# Patient Record
Sex: Female | Born: 1960 | Race: White | Hispanic: No | Marital: Married | Smoking: Never smoker
Health system: Southern US, Community
[De-identification: ages and names within clinical notes are randomized; demographics above are authoritative.]

## PROBLEM LIST (undated history)

## (undated) DIAGNOSIS — J45909 Unspecified asthma, uncomplicated: Secondary | ICD-10-CM

## (undated) DIAGNOSIS — F329 Major depressive disorder, single episode, unspecified: Secondary | ICD-10-CM

## (undated) DIAGNOSIS — F419 Anxiety disorder, unspecified: Secondary | ICD-10-CM

## (undated) DIAGNOSIS — F32A Depression, unspecified: Secondary | ICD-10-CM

## (undated) HISTORY — PX: ABLATION: SHX5711

## (undated) HISTORY — DX: Anxiety disorder, unspecified: F41.9

## (undated) HISTORY — DX: Depression, unspecified: F32.A

## (undated) HISTORY — DX: Unspecified asthma, uncomplicated: J45.909

## (undated) HISTORY — DX: Major depressive disorder, single episode, unspecified: F32.9

---

## 2017-12-23 ENCOUNTER — Encounter: Payer: Self-pay | Admitting: Physician Assistant

## 2017-12-23 ENCOUNTER — Ambulatory Visit: Payer: BLUE CROSS/BLUE SHIELD | Admitting: Physician Assistant

## 2017-12-23 VITALS — BP 117/68 | HR 80 | Ht 63.0 in | Wt 166.4 lb

## 2017-12-23 DIAGNOSIS — Z1211 Encounter for screening for malignant neoplasm of colon: Secondary | ICD-10-CM

## 2017-12-23 DIAGNOSIS — G5702 Lesion of sciatic nerve, left lower limb: Secondary | ICD-10-CM

## 2017-12-23 DIAGNOSIS — F339 Major depressive disorder, recurrent, unspecified: Secondary | ICD-10-CM | POA: Diagnosis not present

## 2017-12-23 MED ORDER — DULOXETINE HCL 30 MG PO CPEP: 30.0000 mg | ORAL_CAPSULE | Freq: Every day | ORAL | 2 refills | Status: DC

## 2017-12-23 MED ORDER — OMEPRAZOLE 20 MG PO CPDR: 20.0000 mg | DELAYED_RELEASE_CAPSULE | Freq: Every day | ORAL | 3 refills | Status: DC

## 2017-12-23 MED ORDER — FLUOXETINE HCL 20 MG PO TABS
20.0000 mg | ORAL_TABLET | Freq: Every day | ORAL | 3 refills | Status: DC
Start: 2017-12-23 — End: 2017-12-23

## 2017-12-23 MED ORDER — HYDROCODONE-ACETAMINOPHEN 10-325 MG PO TABS: 1.0000 | ORAL_TABLET | ORAL | 0 refills | Status: DC | PRN

## 2017-12-25 DIAGNOSIS — F339 Major depressive disorder, recurrent, unspecified: Secondary | ICD-10-CM | POA: Insufficient documentation

## 2017-12-25 DIAGNOSIS — G5702 Lesion of sciatic nerve, left lower limb: Secondary | ICD-10-CM | POA: Insufficient documentation

## 2017-12-25 NOTE — Progress Notes (Signed)
BP 117/68   Pulse 80   Ht  (1.6 m)   Wt 166 lb 6.4 oz (75.5 kg)   BMI 29.48 kg/m    Subjective:    Patient ID: Victoria Perry, female    DOB: [birth year], 59 y.o.   MRN: [id number]  HPI: Victoria Perry is a 57 y.o. female presenting on 12/23/2017 for New Patient (Initial Visit) and Establish Care  This is a new patient to our office.  She does have  medical history is positive for depression and mood disorder.  She also has degenerative disc disease with some sciatica.  She does have chronic pain in her sciatic nerve.  When she takes an anti-inflammatory it does help and she does not have any chronic GERD problems.  I would like her to stay on this on a more consistent basis we discussed using omeprazole as protection for her stomach.  Many years ago she did have some GERD problems.  She denies anything at this time.  She does still have some anxiety and depression most days.  Past Medical History:  Diagnosis Date  . Anxiety   . Asthma   . Depression    Relevant past medical, surgical, family and social history reviewed and updated as indicated. Interim medical history since our last visit reviewed. Allergies and medications reviewed and updated. DATA REVIEWED: CHART IN EPIC  Family History reviewed for pertinent findings.  Review of Systems  Constitutional: Negative.  Negative for activity change, fatigue and fever.  HENT: Negative.   Eyes: Negative.   Respiratory: Negative.  Negative for cough.   Cardiovascular: Negative.  Negative for chest pain.  Gastrointestinal: Negative.  Negative for abdominal pain.  Endocrine: Negative.   Genitourinary: Negative.  Negative for dysuria.  Musculoskeletal: Positive for arthralgias, back pain, myalgias, neck pain and neck stiffness.  Skin: Negative.   Neurological: Negative.   Psychiatric/Behavioral: Positive for decreased concentration and dysphoric mood. The patient is nervous/anxious.     Allergies as of 12/23/2017   No  Known Allergies     Medication List        Accurate as of 12/23/17 11:59 PM. Always use your most recent med list.          DULoxetine 30 MG capsule Commonly known as:  CYMBALTA Take 1 capsule (30 mg total) by mouth daily. After 1 week take 2 tabs   HYDROcodone-acetaminophen 10-325 MG tablet Commonly known as:  NORCO Take 1 tablet by mouth every 4 (four) hours as needed.   omeprazole 20 MG capsule Commonly known as:  PRILOSEC Take 1 capsule (20 mg total) by mouth daily.          Objective:    BP 117/68   Pulse 80   Ht  (1.6 m)   Wt 166 lb 6.4 oz (75.5 kg)   BMI 29.48 kg/m   No Known Allergies  Wt Readings from Last 3 Encounters:  12/23/17 166 lb 6.4 oz (75.5 kg)    Physical Exam  Constitutional: She is oriented to person, place, and time. She appears well-developed and well-nourished.  HENT:  Head: Normocephalic and atraumatic.  Eyes: Pupils are equal, round, and reactive to light. Conjunctivae and EOM are normal.  Cardiovascular: Normal rate, regular rhythm, normal heart sounds and intact distal pulses.  Pulmonary/Chest: Effort normal and breath sounds normal.  Abdominal: Soft. Bowel sounds are normal.  Neurological: She is alert and oriented to person, place, and time. She has normal reflexes.  Skin:  Skin is warm and dry. No rash noted.  Psychiatric: She has a normal mood and affect. Her behavior is normal. Judgment and thought content normal.    No results found for this or any previous visit.    Assessment & Plan:   1. Encounter for screening colonoscopy - Ambulatory referral to Gastroenterology  2. Depression, recurrent (HCC) - DULoxetine (CYMBALTA) 30 MG capsule; Take 1 capsule (30 mg total) by mouth daily. After 1 week take 2 tabs  Dispense: 60 capsule; Refill: 2  3. Neuropathy of left sciatic nerve - HYDROcodone-acetaminophen (NORCO) 10-325 MG tablet; Take 1 tablet by mouth every 4 (four) hours as needed.  Dispense: 30 tablet; Refill: 0 -  DULoxetine (CYMBALTA) 30 MG capsule; Take 1 capsule (30 mg total) by mouth daily. After 1 week take 2 tabs  Dispense: 60 capsule; Refill: 2    Continue all other maintenance medications as listed above.  Follow up plan: Return in about 1 month (around 01/20/2018) for recheck.  Educational handout given for survey  Remus Loffler PA-C [facility] [address]  [place], [place] [address] [phone]   12/25/2017, 9:40 PM

## 2017-12-27 ENCOUNTER — Encounter: Payer: Self-pay | Admitting: Internal Medicine

## 2018-01-17 ENCOUNTER — Encounter: Payer: Self-pay | Admitting: Physician Assistant

## 2018-01-17 ENCOUNTER — Ambulatory Visit: Payer: BLUE CROSS/BLUE SHIELD | Admitting: Physician Assistant

## 2018-01-17 DIAGNOSIS — F339 Major depressive disorder, recurrent, unspecified: Secondary | ICD-10-CM

## 2018-01-17 DIAGNOSIS — G5702 Lesion of sciatic nerve, left lower limb: Secondary | ICD-10-CM | POA: Diagnosis not present

## 2018-01-17 MED ORDER — OMEPRAZOLE 20 MG PO CPDR: 20.0000 mg | DELAYED_RELEASE_CAPSULE | Freq: Every day | ORAL | 11 refills | Status: DC

## 2018-01-17 MED ORDER — DULOXETINE HCL 60 MG PO CPEP: 60.0000 mg | ORAL_CAPSULE | Freq: Every day | ORAL | 5 refills | Status: DC

## 2018-01-17 MED ORDER — HYDROCODONE-ACETAMINOPHEN 10-325 MG PO TABS: 1.0000 | ORAL_TABLET | ORAL | 0 refills | Status: DC | PRN

## 2018-01-17 NOTE — Patient Instructions (Signed)
In a few days you may receive a survey in the mail or online from [employer] regarding your visit with us today. Please take a moment to fill this out. Your feedback is very important to our whole office. It can help us better understand your needs as well as improve your experience and satisfaction. Thank you for taking your time to complete it. We care about you.  Cypress Hinkson, PA-C  

## 2018-01-18 NOTE — Progress Notes (Signed)
BP 110/62   Pulse 69   Temp 98.2 F (36.8 C) (Oral)   Ht  (1.6 m)   Wt 164 lb (74.4 kg)   BMI 29.05 kg/m    Subjective:    Patient ID: Victoria Perry, female    DOB: December 13, 1960, 57 y.o.   MRN: [id number]  HPI: Victoria Perry is a 57 y.o. female presenting on 01/17/2018 for Depression (1 month follow up ) and Back Pain (medication refill)  This patient comes in for a one-month follow-up on her depression and back pain.  She states that she is tolerating the Cymbalta extremely well.  And she states that she has a great improvement in her emotions and depression.  She is feeling more energetic.  She is able to take 1 or 2 pain pills a day and it can control on the severe amount of pain that she has.  She is tolerating all of her medications very well.  Past Medical History:  Diagnosis Date  . Anxiety   . Asthma   . Depression    Relevant past medical, surgical, family and social history reviewed and updated as indicated. Interim medical history since our last visit reviewed. Allergies and medications reviewed and updated. DATA REVIEWED: CHART IN EPIC  Family History reviewed for pertinent findings.  Review of Systems  Constitutional: Negative.   HENT: Negative.   Eyes: Negative.   Respiratory: Negative.   Gastrointestinal: Negative.   Genitourinary: Negative.     Allergies as of 01/17/2018   No Known Allergies     Medication List        Accurate as of 01/17/18 11:59 PM. Always use your most recent med list.          DULoxetine 60 MG capsule Commonly known as:  CYMBALTA Take 1 capsule (60 mg total) by mouth daily.   HYDROcodone-acetaminophen 10-325 MG tablet Commonly known as:  NORCO Take 1 tablet by mouth every 4 (four) hours as needed.   omeprazole 20 MG capsule Commonly known as:  PRILOSEC Take 1 capsule (20 mg total) by mouth daily.          Objective:    BP 110/62   Pulse 69   Temp 98.2 F (36.8 C) (Oral)   Ht  (1.6 m)    Wt 164 lb (74.4 kg)   BMI 29.05 kg/m   No Known Allergies  Wt Readings from Last 3 Encounters:  01/17/18 164 lb (74.4 kg)  12/23/17 166 lb 6.4 oz (75.5 kg)    Physical Exam  Constitutional: She is oriented to person, place, and time. She appears well-developed and well-nourished.  HENT:  Head: Normocephalic and atraumatic.  Eyes: Pupils are equal, round, and reactive to light. Conjunctivae and EOM are normal.  Cardiovascular: Normal rate, regular rhythm, normal heart sounds and intact distal pulses.  Pulmonary/Chest: Effort normal and breath sounds normal.  Abdominal: Soft. Bowel sounds are normal.  Neurological: She is alert and oriented to person, place, and time. She has normal reflexes.  Skin: Skin is warm and dry. No rash noted.  Psychiatric: She has a normal mood and affect. Her behavior is normal. Judgment and thought content normal.    No results found for this or any previous visit.    Assessment & Plan:   1. Depression, recurrent (HCC) - DULoxetine (CYMBALTA) 60 MG capsule; Take 1 capsule (60 mg total) by mouth daily.  Dispense: 30 capsule; Refill: 5  2. Neuropathy of left  sciatic nerve - DULoxetine (CYMBALTA) 60 MG capsule; Take 1 capsule (60 mg total) by mouth daily.  Dispense: 30 capsule; Refill: 5 - HYDROcodone-acetaminophen (NORCO) 10-325 MG tablet; Take 1 tablet by mouth every 4 (four) hours as needed.  Dispense: 90 tablet; Refill: 0   Continue all other maintenance medications as listed above.  Follow up plan: Return in about 3 months (around 04/19/2018).  Educational handout given for survey  Remus Loffler PA-C [facility] [address]  [place], [place] [address] [phone]   01/18/2018, 10:21 AM

## 2018-02-01 ENCOUNTER — Ambulatory Visit (INDEPENDENT_AMBULATORY_CARE_PROVIDER_SITE_OTHER): Payer: BLUE CROSS/BLUE SHIELD | Admitting: Physician Assistant

## 2018-02-01 ENCOUNTER — Encounter: Payer: Self-pay | Admitting: Physician Assistant

## 2018-02-01 VITALS — BP 123/68 | HR 62 | Temp 97.3°F | Ht 63.0 in | Wt 163.2 lb

## 2018-02-01 DIAGNOSIS — Z01419 Encounter for gynecological examination (general) (routine) without abnormal findings: Secondary | ICD-10-CM

## 2018-02-01 DIAGNOSIS — N952 Postmenopausal atrophic vaginitis: Secondary | ICD-10-CM

## 2018-02-01 MED ORDER — ESTROGENS, CONJUGATED 0.625 MG/GM VA CREA: 1.0000 | TOPICAL_CREAM | Freq: Every day | VAGINAL | 12 refills | Status: DC

## 2018-02-03 ENCOUNTER — Telehealth: Payer: Self-pay | Admitting: Physician Assistant

## 2018-02-03 LAB — PAP IG W/ RFLX HPV ASCU: PAP Smear Comment: 0

## 2018-02-03 NOTE — Telephone Encounter (Signed)
Aware. 

## 2018-02-05 NOTE — Progress Notes (Signed)
BP 123/68   Pulse 62   Temp (!) 97.3 F (36.3 C) (Oral)   Ht _0  (1.6 m)   Wt 163 lb 3.2 oz (74 kg)   BMI 28.91 kg/m    Subjective:    Patient ID: Victoria Perry, female    DOB: Feb 08, 1961, 57 y.o.   MRN: [id number]  HPI: Victoria Perry is a 57 y.o. female presenting on 02/01/2018 for Annual Exam  This patient comes in for annual well physical examination. All medications are reviewed today. There are no reports of any problems with the medications. All of the medical conditions are reviewed and updated.  Lab work is reviewed and will be ordered as medically necessary. There are no new problems reported with today's visit.  Patient reports doing well overall.  Ever since her hysterectomy she has experienced vaginal dryness. Does not want to take oral estrogen.  Past Medical History:  Diagnosis Date  . Anxiety   . Asthma   . Depression    Relevant past medical, surgical, family and social history reviewed and updated as indicated. Interim medical history since our last visit reviewed. Allergies and medications reviewed and updated. DATA REVIEWED: CHART IN EPIC  Family History reviewed for pertinent findings.  Review of Systems  Constitutional: Negative.  Negative for activity change, fatigue and fever.  HENT: Negative.   Eyes: Negative.   Respiratory: Negative.  Negative for cough.   Cardiovascular: Negative.  Negative for chest pain.  Gastrointestinal: Negative.  Negative for abdominal pain.  Endocrine: Negative.   Genitourinary: Negative.  Negative for dysuria.  Musculoskeletal: Negative.   Skin: Negative.   Neurological: Negative.     Allergies as of 02/01/2018   No Known Allergies     Medication List        Accurate as of 02/01/18 11:59 PM. Always use your most recent med list.          conjugated estrogens vaginal cream Commonly known as:  PREMARIN Place 1 Applicatorful vaginally daily.   DULoxetine 60 MG capsule Commonly known as:   CYMBALTA Take 1 capsule (60 mg total) by mouth daily.   HYDROcodone-acetaminophen 10-325 MG tablet Commonly known as:  NORCO Take 1 tablet by mouth every 4 (four) hours as needed.   omeprazole 20 MG capsule Commonly known as:  PRILOSEC Take 1 capsule (20 mg total) by mouth daily.          Objective:    BP 123/68   Pulse 62   Temp (!) 97.3 F (36.3 C) (Oral)   Ht _1  (1.6 m)   Wt 163 lb 3.2 oz (74 kg)   BMI 28.91 kg/m   No Known Allergies  Wt Readings from Last 3 Encounters:  02/01/18 163 lb 3.2 oz (74 kg)  01/17/18 164 lb (74.4 kg)  12/23/17 166 lb 6.4 oz (75.5 kg)    Physical Exam  Constitutional: She is oriented to person, place, and time. She appears well-developed and well-nourished.  HENT:  Head: Normocephalic and atraumatic.  Eyes: Pupils are equal, round, and reactive to light. Conjunctivae and EOM are normal.  Neck: Normal range of motion. Neck supple.  Cardiovascular: Normal rate, regular rhythm, normal heart sounds and intact distal pulses.  Pulmonary/Chest: Effort normal and breath sounds normal. Right breast exhibits no mass, no skin change and no tenderness. Left breast exhibits no mass, no skin change and no tenderness. No breast tenderness, discharge or bleeding. Breasts are symmetrical.  Abdominal: Soft. Bowel sounds are  normal.  Genitourinary: Vagina normal and uterus normal. Rectal exam shows no fissure. No breast tenderness, discharge or bleeding. There is no tenderness or lesion on the right labia. There is no tenderness or lesion on the left labia. Uterus is not deviated, not enlarged and not tender. Cervix exhibits no motion tenderness, no discharge and no friability. Right adnexum displays no mass, no tenderness and no fullness. Left adnexum displays no mass, no tenderness and no fullness. No tenderness or bleeding in the vagina. No vaginal discharge found.  Neurological: She is alert and oriented to person, place, and time. She has normal reflexes.   Skin: Skin is warm and dry. No rash noted.  Psychiatric: She has a normal mood and affect. Her behavior is normal. Judgment and thought content normal.        Assessment & Plan:   1. Well female exam with routine gynecological exam - CBC with Differential/Platelet; Future - CMP14+EGFR; Future - Lipid panel; Future - TSH; Future - Pap IG w/ reflex to HPV when ASC-U  2. Vaginal atrophy - conjugated estrogens (PREMARIN) vaginal cream; Place 1 Applicatorful vaginally daily.  Dispense: 42.5 g; Refill: 12   Continue all other maintenance medications as listed above.  Follow up plan: No follow-ups on file.  Educational handout given for [place] PA-C [place] [address]  [place], [place] [address] [phone]   02/05/2018, 5:00 PM

## 2018-02-09 ENCOUNTER — Other Ambulatory Visit: Payer: Self-pay | Admitting: Physician Assistant

## 2018-02-09 DIAGNOSIS — F339 Major depressive disorder, recurrent, unspecified: Secondary | ICD-10-CM

## 2018-02-09 DIAGNOSIS — G5702 Lesion of sciatic nerve, left lower limb: Secondary | ICD-10-CM

## 2018-02-20 ENCOUNTER — Ambulatory Visit: Payer: Self-pay

## 2018-02-20 ENCOUNTER — Other Ambulatory Visit: Payer: BLUE CROSS/BLUE SHIELD

## 2018-02-20 DIAGNOSIS — Z01419 Encounter for gynecological examination (general) (routine) without abnormal findings: Secondary | ICD-10-CM

## 2018-02-20 LAB — HM MAMMOGRAPHY

## 2018-02-21 LAB — CBC WITH DIFFERENTIAL/PLATELET
BASOS ABS: 0 10*3/uL (ref 0.0–0.2)
Basos: 0 %
EOS (ABSOLUTE): 0.4 10*3/uL (ref 0.0–0.4)
EOS: 6 %
Hematocrit: 38.7 % (ref 34.0–46.6)
Hemoglobin: 12.9 g/dL (ref 11.1–15.9)
IMMATURE GRANULOCYTES: 0 %
Immature Grans (Abs): 0 10*3/uL (ref 0.0–0.1)
Lymphocytes Absolute: 1.8 10*3/uL (ref 0.7–3.1)
Lymphs: 26 %
MCH: 29.5 pg (ref 26.6–33.0)
MCHC: 33.3 g/dL (ref 31.5–35.7)
MCV: 89 fL (ref 79–97)
MONOS ABS: 0.5 10*3/uL (ref 0.1–0.9)
Monocytes: 8 %
NEUTROS PCT: 60 %
Neutrophils Absolute: 4 10*3/uL (ref 1.4–7.0)
PLATELETS: 302 10*3/uL (ref 150–450)
RBC: 4.37 x10E6/uL (ref 3.77–5.28)
RDW: 13.5 % (ref 12.3–15.4)
WBC: 6.8 10*3/uL (ref 3.4–10.8)

## 2018-02-21 LAB — LIPID PANEL
CHOLESTEROL TOTAL: 161 mg/dL (ref 100–199)
Chol/HDL Ratio: 2.2 ratio (ref 0.0–4.4)
HDL: 73 mg/dL (ref >39)
LDL Calculated: 77 mg/dL (ref 0–99)
Triglycerides: 53 mg/dL (ref 0–149)
VLDL Cholesterol Cal: 11 mg/dL (ref 5–40)

## 2018-02-21 LAB — CMP14+EGFR
ALBUMIN: 4.6 g/dL (ref 3.5–5.5)
ALK PHOS: 69 IU/L (ref 39–117)
ALT: 16 IU/L (ref 0–32)
AST: 14 IU/L (ref 0–40)
Albumin/Globulin Ratio: 2.4 — ABNORMAL HIGH (ref 1.2–2.2)
BUN / CREAT RATIO: 21 (ref 9–23)
BUN: 16 mg/dL (ref 6–24)
Bilirubin Total: 0.3 mg/dL (ref 0.0–1.2)
CALCIUM: 9 mg/dL (ref 8.7–10.2)
CO2: 24 mmol/L (ref 20–29)
CREATININE: 0.78 mg/dL (ref 0.57–1.00)
Chloride: 108 mmol/L — ABNORMAL HIGH (ref 96–106)
GFR calc Af Amer: 97 mL/min/{1.73_m2} (ref >59)
GFR, EST NON AFRICAN AMERICAN: 84 mL/min/{1.73_m2} (ref >59)
GLOBULIN, TOTAL: 1.9 g/dL (ref 1.5–4.5)
GLUCOSE: 100 mg/dL — AB (ref 65–99)
POTASSIUM: 4.5 mmol/L (ref 3.5–5.2)
SODIUM: 143 mmol/L (ref 134–144)
Total Protein: 6.5 g/dL (ref 6.0–8.5)

## 2018-02-21 LAB — TSH: TSH: 1.67 u[IU]/mL (ref 0.450–4.500)

## 2018-03-10 ENCOUNTER — Other Ambulatory Visit: Payer: Self-pay | Admitting: Physician Assistant

## 2018-03-10 DIAGNOSIS — G5702 Lesion of sciatic nerve, left lower limb: Secondary | ICD-10-CM

## 2018-03-10 NOTE — Telephone Encounter (Signed)
Refills during appts appt made for 03/13/18

## 2018-03-13 ENCOUNTER — Ambulatory Visit: Payer: Self-pay | Admitting: Physician Assistant

## 2018-03-13 ENCOUNTER — Ambulatory Visit: Payer: BLUE CROSS/BLUE SHIELD | Admitting: Physician Assistant

## 2018-03-13 ENCOUNTER — Encounter: Payer: Self-pay | Admitting: Physician Assistant

## 2018-03-13 VITALS — BP 119/73 | HR 72 | Temp 98.2°F | Ht 63.0 in | Wt 159.2 lb

## 2018-03-13 DIAGNOSIS — R197 Diarrhea, unspecified: Secondary | ICD-10-CM

## 2018-03-13 DIAGNOSIS — G5702 Lesion of sciatic nerve, left lower limb: Secondary | ICD-10-CM | POA: Diagnosis not present

## 2018-03-13 DIAGNOSIS — F339 Major depressive disorder, recurrent, unspecified: Secondary | ICD-10-CM | POA: Diagnosis not present

## 2018-03-13 MED ORDER — HYDROCODONE-ACETAMINOPHEN 10-325 MG PO TABS: 1.0000 | ORAL_TABLET | ORAL | 0 refills | Status: DC

## 2018-03-13 MED ORDER — HYDROCODONE-ACETAMINOPHEN 10-325 MG PO TABS: 1.0000 | ORAL_TABLET | Freq: Three times a day (TID) | ORAL | 0 refills | Status: DC | PRN

## 2018-03-13 MED ORDER — DULOXETINE HCL 60 MG PO CPEP: ORAL_CAPSULE | ORAL | 5 refills | Status: DC

## 2018-03-13 NOTE — Progress Notes (Signed)
BP 119/73   Pulse 72   Temp 98.2 F (36.8 C) (Oral)   Ht _0  (1.6 m)   Wt 159 lb 3.4 oz (72.2 kg)   BMI 28.20 kg/m    Subjective:    Patient ID: Victoria Perry, female    DOB: [birth year], 57 y.o.   MRN: [id number]  HPI: Victoria Perry is a 57 y.o. female presenting on 03/13/2018 for Diarrhea; Dizziness; Chills; and Depression (1 month follow up )   This patient comes in for 1 month recheck on her depression.  She has had a great improvement since starting the Cymbalta 60 mg 1 daily.  She states she thinks she can tell a difference also with her osteoarthritis pain.  She would like to continue the medicine.  She did start with a stomach virus approximately 1 day ago.  She was able to come to her visit today. This patient comes in with 2 day history of nausea and vomiting.  In the beginning nausea and vomiting were the only symptoms and halfway through more diarrhea.  Last time the patient ate was 2 days ago Positive exposure to others with gastroenteritis Denies fever. Denies blood.  Depression screen [facility] 2/9 03/13/2018 02/01/2018 01/17/2018 12/23/2017  Decreased Interest 0 0 0 2  Down, Depressed, Hopeless 0 0 0 3  PHQ - 2 Score 0 0 0 5  Altered sleeping - - - 3  Tired, decreased energy - - - 3  Change in appetite - - - 2  Feeling bad or failure about yourself  - - - 3  Trouble concentrating - - - 3  Moving slowly or fidgety/restless - - - 2  Suicidal thoughts - - - 3  PHQ-9 Score - - - 24     Past Medical History:  Diagnosis Date  . Anxiety   . Asthma   . Depression    Relevant past medical, surgical, family and social history reviewed and updated as indicated. Interim medical history since our last visit reviewed. Allergies and medications reviewed and updated. DATA REVIEWED: CHART IN EPIC  Family History reviewed for pertinent findings.  Review of Systems  Constitutional: Negative.   HENT: Negative.   Eyes: Negative.   Respiratory: Negative.     Gastrointestinal: Positive for diarrhea and nausea. Negative for vomiting.  Genitourinary: Negative.   Musculoskeletal: Positive for arthralgias and myalgias.    Allergies as of 03/13/2018   No Known Allergies     Medication List        Accurate as of 03/13/18 11:59 PM. Always use your most recent med list.          conjugated estrogens vaginal cream Commonly known as:  PREMARIN Place 1 Applicatorful vaginally daily.   DULoxetine 60 MG capsule Commonly known as:  CYMBALTA TAKE 1 CAPSULE BY MOUTH EVERY DAY   HYDROcodone-acetaminophen 10-325 MG tablet Commonly known as:  NORCO Take 1 tablet by mouth every 8 (eight) weeks.   HYDROcodone-acetaminophen 10-325 MG tablet Commonly known as:  NORCO Take 1 tablet by mouth every 8 (eight) hours as needed.   HYDROcodone-acetaminophen 10-325 MG tablet Commonly known as:  NORCO Take 1 tablet by mouth every 8 (eight) hours as needed.   omeprazole 20 MG capsule Commonly known as:  PRILOSEC Take 1 capsule (20 mg total) by mouth daily.          Objective:    BP 119/73   Pulse 72   Temp 98.2 F (36.8 C) (  Oral)   Ht _0  (1.6 m)   Wt 159 lb 3.4 oz (72.2 kg)   BMI 28.20 kg/m   No Known Allergies  Wt Readings from Last 3 Encounters:  03/13/18 159 lb 3.4 oz (72.2 kg)  02/01/18 163 lb 3.2 oz (74 kg)  01/17/18 164 lb (74.4 kg)    Physical Exam  Constitutional: She is oriented to person, place, and time. She appears well-developed and well-nourished.  HENT:  Head: Normocephalic and atraumatic.  Eyes: Pupils are equal, round, and reactive to light. Conjunctivae and EOM are normal.  Cardiovascular: Normal rate, regular rhythm, normal heart sounds and intact distal pulses.  Pulmonary/Chest: Effort normal and breath sounds normal.  Abdominal: Soft. She exhibits no distension and no mass. Bowel sounds are increased. There is tenderness in the right upper quadrant, epigastric area and left upper quadrant. There is no rebound and  no guarding.  Neurological: She is alert and oriented to person, place, and time. She has normal reflexes.  Skin: Skin is warm and dry. No rash noted.  Psychiatric: She has a normal mood and affect. Her behavior is normal. Judgment and thought content normal.    Results for orders placed or performed in visit on 02/20/18  TSH  Result Value Ref Range   TSH 1.670 0.450 - 4.500 uIU/mL  Lipid panel  Result Value Ref Range   Cholesterol, Total 161 100 - 199 mg/dL   Triglycerides 53 0 - 149 mg/dL   HDL 73 >39 mg/dL   VLDL Cholesterol Cal 11 5 - 40 mg/dL   LDL Calculated 77 0 - 99 mg/dL   Chol/HDL Ratio 2.2 0.0 - 4.4 ratio  CMP14+EGFR  Result Value Ref Range   Glucose 100 (H) 65 - 99 mg/dL   BUN 16 6 - 24 mg/dL   Creatinine, Ser 0.78 0.57 - 1.00 mg/dL   GFR calc non Af Amer 84 >59 mL/min/1.73   GFR calc Af Amer 97 >59 mL/min/1.73   BUN/Creatinine Ratio 21 9 - 23   Sodium 143 134 - 144 mmol/L   Potassium 4.5 3.5 - 5.2 mmol/L   Chloride 108 (H) 96 - 106 mmol/L   CO2 24 20 - 29 mmol/L   Calcium 9.0 8.7 - 10.2 mg/dL   Total Protein 6.5 6.0 - 8.5 g/dL   Albumin 4.6 3.5 - 5.5 g/dL   Globulin, Total 1.9 1.5 - 4.5 g/dL   Albumin/Globulin Ratio 2.4 (H) 1.2 - 2.2   Bilirubin Total 0.3 0.0 - 1.2 mg/dL   Alkaline Phosphatase 69 39 - 117 IU/L   AST 14 0 - 40 IU/L   ALT 16 0 - 32 IU/L  CBC with Differential/Platelet  Result Value Ref Range   WBC 6.8 3.4 - 10.8 x10E3/uL   RBC 4.37 3.77 - 5.28 x10E6/uL   Hemoglobin 12.9 11.1 - 15.9 g/dL   Hematocrit 38.7 34.0 - 46.6 %   MCV 89 79 - 97 fL   MCH 29.5 26.6 - 33.0 pg   MCHC 33.3 31.5 - 35.7 g/dL   RDW 13.5 12.3 - 15.4 %   Platelets 302 150 - 450 x10E3/uL   Neutrophils 60 Not Estab. %   Lymphs 26 Not Estab. %   Monocytes 8 Not Estab. %   Eos 6 Not Estab. %   Basos 0 Not Estab. %   Neutrophils Absolute 4.0 1.4 - 7.0 x10E3/uL   Lymphocytes Absolute 1.8 0.7 - 3.1 x10E3/uL   Monocytes Absolute 0.5 0.1 - 0.9 x10E3/uL  EOS (ABSOLUTE) 0.4 0.0  - 0.4 x10E3/uL   Basophils Absolute 0.0 0.0 - 0.2 x10E3/uL   Immature Granulocytes 0 Not Estab. %   Immature Grans (Abs) 0.0 0.0 - 0.1 x10E3/uL      Assessment & Plan:   1. Depression, recurrent (HCC) - DULoxetine (CYMBALTA) 60 MG capsule; TAKE 1 CAPSULE BY MOUTH EVERY DAY  Dispense: 30 capsule; Refill: 5  2. Neuropathy of left sciatic nerve - DULoxetine (CYMBALTA) 60 MG capsule; TAKE 1 CAPSULE BY MOUTH EVERY DAY  Dispense: 30 capsule; Refill: 5 - HYDROcodone-acetaminophen (NORCO) 10-325 MG tablet; Take 1 tablet by mouth every 8 (eight) weeks.  Dispense: 90 tablet; Refill: 0  3. Diarrhea of presumed infectious origin BRAT diet   Continue all other maintenance medications as listed above.  Follow up plan: Return in about 4 months (around 07/14/2018) for recheck.  Educational handout given for [place] PA-C [place] [address]  [place], [place] [address] [phone]   03/14/2018, 8:48 AM

## 2018-03-13 NOTE — Patient Instructions (Signed)

## 2018-03-14 ENCOUNTER — Ambulatory Visit: Payer: Self-pay | Admitting: Gastroenterology

## 2018-03-14 ENCOUNTER — Telehealth: Payer: Self-pay | Admitting: Physician Assistant

## 2018-03-14 NOTE — Telephone Encounter (Signed)
Note written and patient notified. Left up front for patient pick up

## 2018-03-15 ENCOUNTER — Telehealth: Payer: Self-pay | Admitting: Physician Assistant

## 2018-03-15 NOTE — Telephone Encounter (Signed)
Can order stool culture, fecal WBCs, rotazyme, c diff toxin, ova and parasite. She can come get cups for collection.  Can try immodium up to 6  Per day to try and slow down the diarrhea.

## 2018-03-15 NOTE — Telephone Encounter (Signed)
Aware of provider's advise.  She will come by for tests and hopes you will give her an excuse to stay home from work.

## 2018-03-15 NOTE — Telephone Encounter (Signed)
Would like a note for the rest of the week- per note okay to excuse. Letter placed up front

## 2018-03-15 NOTE — Telephone Encounter (Signed)
Please advise 

## 2018-03-15 NOTE — Telephone Encounter (Signed)
It is okay for a note, even for the rest of the week if needed

## 2018-03-17 ENCOUNTER — Other Ambulatory Visit: Payer: BLUE CROSS/BLUE SHIELD

## 2018-03-17 DIAGNOSIS — Z1211 Encounter for screening for malignant neoplasm of colon: Secondary | ICD-10-CM

## 2018-03-17 NOTE — Addendum Note (Signed)
Addended by: Margorie JohnJOHNSON, Serenity Batley M on: 03/17/2018 04:32 PM   Modules accepted: Orders

## 2018-03-20 LAB — FECAL OCCULT BLOOD, IMMUNOCHEMICAL: Fecal Occult Bld: NEGATIVE

## 2018-04-05 NOTE — Progress Notes (Signed)
In Care Everywhere  

## 2018-06-02 ENCOUNTER — Ambulatory Visit: Payer: Self-pay | Admitting: Gastroenterology

## 2018-06-02 ENCOUNTER — Encounter: Payer: Self-pay | Admitting: Gastroenterology

## 2018-06-02 VITALS — BP 135/64 | HR 78 | Temp 97.1°F | Ht 63.0 in | Wt 160.6 lb

## 2018-06-02 DIAGNOSIS — R109 Unspecified abdominal pain: Secondary | ICD-10-CM | POA: Insufficient documentation

## 2018-06-02 DIAGNOSIS — R197 Diarrhea, unspecified: Secondary | ICD-10-CM

## 2018-06-02 DIAGNOSIS — K625 Hemorrhage of anus and rectum: Secondary | ICD-10-CM

## 2018-06-02 DIAGNOSIS — R1084 Generalized abdominal pain: Secondary | ICD-10-CM

## 2018-06-02 DIAGNOSIS — Z800 Family history of malignant neoplasm of digestive organs: Secondary | ICD-10-CM

## 2018-06-02 NOTE — Assessment & Plan Note (Signed)
57 year old female presenting with chronic intermittent diarrhea associated with abdominal cramping, rectal bleeding which is been occurring for years.  She reports mother had colon cancer but also reports having required Remicade for her colon but she is vague on the details.  Brother and sister with colitis.  Patient had a remote colonoscopy at age 21 and was told that she had colitis.  Would recommend colonoscopy at this time.  Plan for deep sedation given polypharmacy.  Would be concerned about possibility of IBD, microscopic colitis,  but cannot exclude IBS with benign anal rectal bleeding. I have discussed the risks, alternatives, benefits with regards to but not limited to the risk of reaction to medication, bleeding, infection, perforation and the patient is agreeable to proceed. Written consent to be obtained.   We will screen for celiac as well.

## 2018-06-02 NOTE — Progress Notes (Signed)
Primary Care Physician:  Remus Loffler, PA-C  Primary Gastroenterologist:  Roetta Sessions, MD   Chief Complaint  Patient presents with  . Consult    TCS-last done age 56  . Diarrhea    1-2 times per month  . Rectal Bleeding    not often, maybe once every 4-6 months    HPI:  Victoria Perry is a 58 y.o. female here at the request of Prudy Feeler, PA-C for screening colonoscopy.  Patient reports chronic diarrhea occurring 1-2 times per month, lasting up to a week at a time.  Often associated with missing work.  Associated with abdominal cramping which improves with defecation.  Some bright red blood per rectum.  During the week of diarrhea she will have nocturnal symptoms.  She is had these problems for years.  She does have occasional constipation and normal stools.  No nausea or vomiting.  Heartburn well controlled on omeprazole.  No dysphagia.  No weight loss.  Family history not entirely clear.  She reports that her brother and sister both have colitis but she does not have any details.  She initially reports that her mother had colon cancer, she did have a colostomy.  Died at 2 and was diagnosed around age 41.  She also reports that her mother required Remicade for her colon and had a lot of polyps.   Current Outpatient Medications  Medication Sig Dispense Refill  . conjugated estrogens (PREMARIN) vaginal cream Place 1 Applicatorful vaginally daily. 42.5 g 12  . DULoxetine (CYMBALTA) 60 MG capsule TAKE 1 CAPSULE BY MOUTH EVERY DAY 30 capsule 5  . HYDROcodone-acetaminophen (NORCO) 10-325 MG tablet Take 1 tablet by mouth every 8 (eight) hours as needed. 90 tablet 0  . omeprazole (PRILOSEC) 20 MG capsule Take 1 capsule (20 mg total) by mouth daily. 30 capsule 11  . HYDROcodone-acetaminophen (NORCO) 10-325 MG tablet Take 1 tablet by mouth every 8 (eight) weeks. 90 tablet 0  . HYDROcodone-acetaminophen (NORCO) 10-325 MG tablet Take 1 tablet by mouth every 8 (eight) hours as needed. 90  tablet 0   No current facility-administered medications for this visit.     Allergies as of 06/02/2018  . (No Known Allergies)    Past Medical History:  Diagnosis Date  . Anxiety   . Asthma   . Depression     Past Surgical History:  Procedure Laterality Date  . ABLATION      Family History  Problem Relation Age of Onset  . Cancer Mother        colon. also on remicade for her colon but patient not sure why  . Depression Mother   . Depression Sister   . Colitis Sister   . Colitis Brother     Social History   Socioeconomic History  . Marital status: Married    Spouse name: Not on file  . Number of children: Not on file  . Years of education: Not on file  . Highest education level: Not on file  Occupational History  . Not on file  Social Needs  . Financial resource strain: Not on file  . Food insecurity:    Worry: Not on file    Inability: Not on file  . Transportation needs:    Medical: Not on file    Non-medical: Not on file  Tobacco Use  . Smoking status: Never Smoker  . Smokeless tobacco: Never Used  Substance and Sexual Activity  . Alcohol use: Yes    Frequency: Never  Comment: occas  . Drug use: Never  . Sexual activity: Not Currently  Lifestyle  . Physical activity:    Days per week: Not on file    Minutes per session: Not on file  . Stress: Not on file  Relationships  . Social connections:    Talks on phone: Not on file    Gets together: Not on file    Attends religious service: Not on file    Active member of club or organization: Not on file    Attends meetings of clubs or organizations: Not on file    Relationship status: Not on file  . Intimate partner violence:    Fear of current or ex partner: Not on file    Emotionally abused: Not on file    Physically abused: Not on file    Forced sexual activity: Not on file  Other Topics Concern  . Not on file  Social History Narrative  . Not on file      ROS:  General: Negative for  anorexia, weight loss, fever, chills, fatigue, weakness. Eyes: Negative for vision changes.  ENT: Negative for hoarseness, difficulty swallowing , nasal congestion. CV: Negative for chest pain, angina, palpitations, dyspnea on exertion, peripheral edema.  Respiratory: Negative for dyspnea at rest, dyspnea on exertion, cough, sputum, wheezing.  GI: See history of present illness. GU:  Negative for dysuria, hematuria, urinary incontinence, urinary frequency, nocturnal urination.  MS: Negative for joint pain, low back pain.  Derm: Negative for rash or itching.  Neuro: Negative for weakness, abnormal sensation, seizure, frequent headaches, memory loss, confusion.  Psych: Negative for anxiety, depression, suicidal ideation, hallucinations.  Endo: Negative for unusual weight change.  Heme: Negative for bruising or bleeding. Allergy: Negative for rash or hives.    Physical Examination:  BP 135/64   Pulse 78   Temp (!) 97.1 F (36.2 C) (Oral)   Ht 5\' 3"  (1.6 m)   Wt 160 lb 9.6 oz (72.8 kg)   BMI 28.45 kg/m    General: Well-nourished, well-developed in no acute distress.  Head: Normocephalic, atraumatic.   Eyes: Conjunctiva pink, no icterus. Mouth: Oropharyngeal mucosa moist and pink , no lesions erythema or exudate. Neck: Supple without thyromegaly, masses, or lymphadenopathy.  Lungs: Clear to auscultation bilaterally.  Heart: Regular rate and rhythm, no murmurs rubs or gallops.  Abdomen: Bowel sounds are normal, diffuse mild tenderness, nondistended, no hepatosplenomegaly or masses, no abdominal bruits or    hernia , no rebound or guarding.   Rectal: not performed.  Extremities: No lower extremity edema. No clubbing or deformities.  Neuro: Alert and oriented x 4 , grossly normal neurologically.  Skin: Warm and dry, no rash or jaundice.   Psych: Alert and cooperative, normal mood and affect.  Labs: Lab Results  Component Value Date   WBC 6.8 02/20/2018   HGB 12.9 02/20/2018    HCT 38.7 02/20/2018   MCV 89 02/20/2018   PLT 302 02/20/2018   Lab Results  Component Value Date   CREATININE 0.78 02/20/2018   BUN 16 02/20/2018   NA 143 02/20/2018   K 4.5 02/20/2018   CL 108 (H) 02/20/2018   CO2 24 02/20/2018   Lab Results  Component Value Date   ALT 16 02/20/2018   AST 14 02/20/2018   ALKPHOS 69 02/20/2018   BILITOT 0.3 02/20/2018     Imaging Studies: No results found.

## 2018-06-02 NOTE — Patient Instructions (Signed)
1. Colonoscopy as scheduled.  Please see separate instructions. 2. Please have your lab work done first of next week at [facility].  There is a facility at [facility]. [address], [place].  Their phone number is [phone].  Hours 7am to 5pm. 3. If you have not heard from our office regarding your lab results within 3 to 4 days after you have your blood work drawn, please call us and inquire to make sure that I received.

## 2018-06-05 ENCOUNTER — Telehealth: Payer: Self-pay

## 2018-06-05 ENCOUNTER — Other Ambulatory Visit: Payer: Self-pay

## 2018-06-05 DIAGNOSIS — K625 Hemorrhage of anus and rectum: Secondary | ICD-10-CM

## 2018-06-05 DIAGNOSIS — R197 Diarrhea, unspecified: Secondary | ICD-10-CM

## 2018-06-05 DIAGNOSIS — R1084 Generalized abdominal pain: Secondary | ICD-10-CM

## 2018-06-05 DIAGNOSIS — Z800 Family history of malignant neoplasm of digestive organs: Secondary | ICD-10-CM

## 2018-06-05 NOTE — Telephone Encounter (Signed)
Called and informed pt of pre-op appt 06/29/18 at 1:45pm. Letter mailed.

## 2018-06-05 NOTE — Progress Notes (Signed)
CC'D TO PCP °

## 2018-06-26 ENCOUNTER — Telehealth: Payer: Self-pay | Admitting: Internal Medicine

## 2018-06-26 NOTE — Telephone Encounter (Signed)
Pt called to say she had her labs done a couple of weeks ago at [facility] in [place] on [address]. She said we should have a copy already, but was letting us be aware that she had them done.

## 2018-06-26 NOTE — Telephone Encounter (Signed)
Spoke with pt and she had her labs done at another facility. Pt was asked to contact that facility and have them fax her lab results to our office.

## 2018-06-29 ENCOUNTER — Encounter (HOSPITAL_COMMUNITY): Payer: Self-pay

## 2018-06-29 ENCOUNTER — Encounter (HOSPITAL_COMMUNITY)
Admission: RE | Admit: 2018-06-29 | Discharge: 2018-06-29 | Disposition: A | Discharge status: Home / Self Care | Payer: Self-pay | Source: Ambulatory Visit | Attending: Internal Medicine | Admitting: Internal Medicine

## 2018-06-29 ENCOUNTER — Telehealth: Payer: Self-pay | Admitting: *Deleted

## 2018-06-29 NOTE — Telephone Encounter (Signed)
noted 

## 2018-06-29 NOTE — Telephone Encounter (Signed)
Received VM from patient. She is scheduled for TCS 07/06/18 and wants to cancel. Will call back to r/s. Reports she is getting new insurance and will call back once it takes effect.  Called patient and she had already called to cancel pre-op. FYI to LSL.

## 2018-07-02 ENCOUNTER — Other Ambulatory Visit: Payer: Self-pay | Admitting: Physician Assistant

## 2018-07-02 DIAGNOSIS — F339 Major depressive disorder, recurrent, unspecified: Secondary | ICD-10-CM

## 2018-07-02 DIAGNOSIS — G5702 Lesion of sciatic nerve, left lower limb: Secondary | ICD-10-CM

## 2018-07-06 ENCOUNTER — Ambulatory Visit (HOSPITAL_COMMUNITY): Admission: RE | Admit: 2018-07-06 | Payer: Self-pay | Source: Ambulatory Visit | Admitting: Internal Medicine

## 2018-07-06 ENCOUNTER — Encounter (HOSPITAL_COMMUNITY): Admission: RE | Payer: Self-pay | Source: Ambulatory Visit

## 2018-07-06 SURGERY — COLONOSCOPY WITH PROPOFOL
Anesthesia: Monitor Anesthesia Care

## 2018-08-02 ENCOUNTER — Encounter: Payer: Self-pay | Admitting: Physician Assistant

## 2018-08-02 ENCOUNTER — Ambulatory Visit (INDEPENDENT_AMBULATORY_CARE_PROVIDER_SITE_OTHER): Payer: 59 | Admitting: Physician Assistant

## 2018-08-02 VITALS — BP 125/69 | HR 88 | Temp 98.5°F | Ht 63.0 in | Wt 152.6 lb

## 2018-08-02 DIAGNOSIS — J0110 Acute frontal sinusitis, unspecified: Secondary | ICD-10-CM

## 2018-08-02 MED ORDER — FLUTICASONE PROPIONATE 50 MCG/ACT NA SUSP: 2.0000 | Freq: Every day | NASAL | 6 refills | Status: DC

## 2018-08-02 MED ORDER — PREDNISONE 10 MG (21) PO TBPK: ORAL_TABLET | ORAL | 0 refills | Status: DC

## 2018-08-02 MED ORDER — AZITHROMYCIN 250 MG PO TABS: ORAL_TABLET | ORAL | 1 refills | Status: DC

## 2018-08-04 NOTE — Progress Notes (Signed)
BP 125/69   Pulse 88   Temp 98.5 F (36.9 C) (Oral)   Ht 5\' 3"  (1.6 m)   Wt 152 lb 9.6 oz (69.2 kg)   BMI 27.03 kg/m    Subjective:    Patient ID: Victoria Perry, female    DOB: 08/05/1961, 57 y.o.   MRN: [id number]  HPI: Victoria Perry is a 57 y.o. female presenting on 08/02/2018 for Sinusitis; Cough; and Ear Pain  This patient has had many days of sinus headache and postnasal drainage. There is copious drainage at times. Denies any fever at this time. There has been a history of sinus infections in the past.  No history of sinus surgery. There is cough at night. It has become more prevalent in recent days.   Past Medical History:  Diagnosis Date  . Anxiety   . Asthma   . Depression    Relevant past medical, surgical, family and social history reviewed and updated as indicated. Interim medical history since our last visit reviewed. Allergies and medications reviewed and updated. DATA REVIEWED: CHART IN EPIC  Family History reviewed for pertinent findings.  Review of Systems  Constitutional: Positive for chills and fatigue. Negative for activity change, appetite change and fever.  HENT: Positive for congestion, postnasal drip, sinus pressure, sinus pain and sore throat.   Eyes: Negative.   Respiratory: Positive for cough. Negative for wheezing.   Cardiovascular: Negative.  Negative for chest pain, palpitations and leg swelling.  Gastrointestinal: Negative.   Genitourinary: Negative.   Musculoskeletal: Negative.   Skin: Negative.   Neurological: Positive for headaches.    Allergies as of 08/02/2018   No Known Allergies     Medication List        Accurate as of 08/02/18 11:59 PM. Always use your most recent med list.          azithromycin 250 MG tablet Commonly known as:  ZITHROMAX Take as directed   DULoxetine 60 MG capsule Commonly known as:  CYMBALTA TAKE 1 CAPSULE BY MOUTH EVERY DAY   fluticasone 50 MCG/ACT nasal spray Commonly known as:   FLONASE Place 2 sprays into both nostrils daily.   HYDROcodone-acetaminophen 10-325 MG tablet Commonly known as:  NORCO Take 1 tablet by mouth every 8 (eight) weeks.   naproxen sodium 220 MG tablet Commonly known as:  ALEVE Take 440 mg by mouth daily as needed (for pain or headache).   omeprazole 20 MG capsule Commonly known as:  PRILOSEC Take 1 capsule (20 mg total) by mouth daily.   predniSONE 10 MG (21) Tbpk tablet Commonly known as:  STERAPRED UNI-PAK 21 TAB As directed x 6 days          Objective:    BP 125/69   Pulse 88   Temp 98.5 F (36.9 C) (Oral)   Ht 5\' 3"  (1.6 m)   Wt 152 lb 9.6 oz (69.2 kg)   BMI 27.03 kg/m   No Known Allergies  Wt Readings from Last 3 Encounters:  08/02/18 152 lb 9.6 oz (69.2 kg)  06/02/18 160 lb 9.6 oz (72.8 kg)  03/13/18 159 lb 3.4 oz (72.2 kg)    Physical Exam  Constitutional: She is oriented to person, place, and time. She appears well-developed and well-nourished.  HENT:  Head: Normocephalic and atraumatic.  Right Ear: Tympanic membrane and external ear normal. No middle ear effusion.  Left Ear: Tympanic membrane and external ear normal.  No middle ear effusion.  Nose: Mucosal edema and  rhinorrhea present. Right sinus exhibits no maxillary sinus tenderness. Left sinus exhibits no maxillary sinus tenderness.  Mouth/Throat: Uvula is midline. Posterior oropharyngeal erythema present.  Eyes: Pupils are equal, round, and reactive to light. Conjunctivae and EOM are normal. Right eye exhibits no discharge. Left eye exhibits no discharge.  Neck: Normal range of motion.  Cardiovascular: Normal rate, regular rhythm and normal heart sounds.  Pulmonary/Chest: Effort normal and breath sounds normal. No respiratory distress. She has no wheezes.  Abdominal: Soft.  Lymphadenopathy:    She has no cervical adenopathy.  Neurological: She is alert and oriented to person, place, and time.  Skin: Skin is warm and dry.  Psychiatric: She has a  normal mood and affect.    Results for orders placed or performed in visit on 04/05/18  HM MAMMOGRAPHY  Result Value Ref Range   HM Mammogram 0-4 Bi-Rad 0-4 Bi-Rad, Self Reported Normal      Assessment & Plan:   1. Acute non-recurrent frontal sinusitis - azithromycin (ZITHROMAX Z-PAK) 250 MG tablet; Take as directed  Dispense: 6 each; Refill: 1 - predniSONE (STERAPRED UNI-PAK 21 TAB) 10 MG (21) TBPK tablet; As directed x 6 days  Dispense: 21 tablet; Refill: 0 - fluticasone (FLONASE) 50 MCG/ACT nasal spray; Place 2 sprays into both nostrils daily.  Dispense: 16 g; Refill: 6   Continue all other maintenance medications as listed above.  Follow up plan: No follow-ups on file.  Educational handout given for survey  Remus LofflerAngel S. Hanan Moen PA-C [facility] [address]  [place], [place] [address] [phone]   08/04/2018, 10:10 AM

## 2018-08-14 ENCOUNTER — Encounter: Payer: Self-pay | Admitting: Physician Assistant

## 2018-08-14 ENCOUNTER — Ambulatory Visit (INDEPENDENT_AMBULATORY_CARE_PROVIDER_SITE_OTHER): Payer: 59 | Admitting: Physician Assistant

## 2018-08-14 DIAGNOSIS — G5702 Lesion of sciatic nerve, left lower limb: Secondary | ICD-10-CM

## 2018-08-14 DIAGNOSIS — F339 Major depressive disorder, recurrent, unspecified: Secondary | ICD-10-CM | POA: Diagnosis not present

## 2018-08-14 MED ORDER — HYDROCODONE-ACETAMINOPHEN 10-325 MG PO TABS: 1.0000 | ORAL_TABLET | Freq: Three times a day (TID) | ORAL | 0 refills | Status: DC | PRN

## 2018-08-14 MED ORDER — DULOXETINE HCL 30 MG PO CPEP: 90.0000 mg | ORAL_CAPSULE | Freq: Every day | ORAL | 5 refills | Status: AC

## 2018-08-14 MED ORDER — HYDROCODONE-ACETAMINOPHEN 10-325 MG PO TABS: 1.0000 | ORAL_TABLET | ORAL | 0 refills | Status: DC

## 2018-08-15 NOTE — Progress Notes (Signed)
BP 130/66   Pulse 74   Temp (!) 97.4 F (36.3 C) (Oral)   Ht 5\' 3"  (1.6 m)   Wt 156 lb 6.4 oz (70.9 kg)   BMI 27.71 kg/m    Subjective:    Patient ID: Victoria Perry, female    DOB: 1961/06/25, 57 y.o.   MRN: [id number]  HPI: AMAYRANI BENNICK is a 57 y.o. 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  She reports that she is doing well with that.  She does need refills.  She is tolerating all of her medications well.  She does feel like she is a little more depressed than how she had been in the past.  We have discussed increasing Cymbalta and she is agreeable to try this. Depression screen [facility] 2/9 08/14/2018 08/02/2018 03/13/2018 02/01/2018 01/17/2018  Decreased Interest 1 1 0 0 0  Down, Depressed, Hopeless 1 1 0 0 0  PHQ - 2 Score 2 2 0 0 0  Altered sleeping 1 1 - - -  Tired, decreased energy 1 1 - - -  Change in appetite 1 1 - - -  Feeling bad or failure about yourself  1 1 - - -  Trouble concentrating 1 1 - - -  Moving slowly or fidgety/restless 0 0 - - -  Suicidal thoughts 1 1 - - -  PHQ-9 Score 8 8 - - -   .   All medications are reviewed today. There are no reports of any problems with the medications. All of the medical conditions are reviewed and updated.  Lab work is reviewed and will be ordered as medically necessary. There are no new problems reported with today's visit.   Past Medical History:  Diagnosis Date  . Anxiety   . Asthma   . Depression    Relevant past medical, surgical, family and social history reviewed and updated as indicated. Interim medical history since our last visit reviewed. Allergies and medications reviewed and updated. DATA REVIEWED: CHART IN EPIC  Family History reviewed for pertinent findings.  Review of Systems  Constitutional: Negative.   HENT: Negative.   Eyes:  Negative.   Respiratory: Negative.   Gastrointestinal: Negative.   Genitourinary: Negative.     Allergies as of 08/14/2018   No Known Allergies     Medication List       Accurate as of August 14, 2018 11:59 PM. Always use your most recent med list.        DULoxetine 30 MG capsule Commonly known as:  CYMBALTA Take 3 capsules (90 mg total) by mouth daily.   fluticasone 50 MCG/ACT nasal spray Commonly known as:  FLONASE Place 2 sprays into both nostrils daily.   HYDROcodone-acetaminophen 10-325 MG tablet Commonly known as:  NORCO Take 1 tablet by mouth every 8 (eight) weeks.   HYDROcodone-acetaminophen 10-325 MG tablet Commonly known as:  NORCO Take 1 tablet by mouth every 8 (eight) hours as needed.   HYDROcodone-acetaminophen 10-325 MG tablet Commonly known as:  NORCO Take 1 tablet by mouth every 8 (eight) hours as needed.   naproxen sodium 220 MG tablet Commonly known as:  ALEVE Take 440 mg by mouth daily as needed (for pain or headache).   omeprazole 20 MG capsule Commonly known as:  PRILOSEC Take 1 capsule (20 mg total) by mouth daily.  Objective:    BP 130/66   Pulse 74   Temp (!) 97.4 F (36.3 C) (Oral)   Ht 5\' 3"  (1.6 m)   Wt 156 lb 6.4 oz (70.9 kg)   BMI 27.71 kg/m   No Known Allergies  Wt Readings from Last 3 Encounters:  08/14/18 156 lb 6.4 oz (70.9 kg)  08/02/18 152 lb 9.6 oz (69.2 kg)  06/02/18 160 lb 9.6 oz (72.8 kg)    Physical Exam Constitutional:      Appearance: She is well-developed.  HENT:     Head: Normocephalic and atraumatic.  Eyes:     Conjunctiva/sclera: Conjunctivae normal.     Pupils: Pupils are equal, round, and reactive to light.  Cardiovascular:     Rate and Rhythm: Normal rate and regular rhythm.     Heart sounds: Normal heart sounds.  Pulmonary:     Effort: Pulmonary effort is normal.     Breath sounds: Normal breath sounds.  Abdominal:     General: Bowel sounds are normal.     Palpations: Abdomen  is soft.  Skin:    General: Skin is warm and dry.     Findings: No rash.  Neurological:     Mental Status: She is alert and oriented to person, place, and time.     Deep Tendon Reflexes: Reflexes are normal and symmetric.  Psychiatric:        Behavior: Behavior normal.        Thought Content: Thought content normal.        Judgment: Judgment normal.         Assessment & Plan:   1. Neuropathy of left sciatic nerve - HYDROcodone-acetaminophen (NORCO) 10-325 MG tablet; Take 1 tablet by mouth every 8 (eight) weeks.  Dispense: 90 tablet; Refill: 0 - DULoxetine (CYMBALTA) 30 MG capsule; Take 3 capsules (90 mg total) by mouth daily.  Dispense: 90 capsule; Refill: 5 - HYDROcodone-acetaminophen (NORCO) 10-325 MG tablet; Take 1 tablet by mouth every 8 (eight) hours as needed.  Dispense: 90 tablet; Refill: 0 - HYDROcodone-acetaminophen (NORCO) 10-325 MG tablet; Take 1 tablet by mouth every 8 (eight) hours as needed.  Dispense: 90 tablet; Refill: 0  2. Depression, recurrent (HCC) - DULoxetine (CYMBALTA) 30 MG capsule; Take 3 capsules (90 mg total) by mouth daily.  Dispense: 90 capsule; Refill: 5   Continue all other maintenance medications as listed above.  Follow up plan: Return in about 4 weeks (around 09/11/2018).  Educational handout given for survey  Remus LofflerAngel S. Delshawn Stech PA-C [facility] [address]  [place], [place] [address] [phone]   08/15/2018, 10:29 AM

## 2018-09-11 ENCOUNTER — Encounter: Payer: Self-pay | Admitting: Family Medicine

## 2018-09-11 ENCOUNTER — Ambulatory Visit (INDEPENDENT_AMBULATORY_CARE_PROVIDER_SITE_OTHER): Payer: 59 | Admitting: Family Medicine

## 2018-09-11 VITALS — BP 138/70 | HR 94 | Temp 101.1°F | Ht 63.0 in | Wt 152.0 lb

## 2018-09-11 DIAGNOSIS — R509 Fever, unspecified: Secondary | ICD-10-CM | POA: Diagnosis not present

## 2018-09-11 DIAGNOSIS — J101 Influenza due to other identified influenza virus with other respiratory manifestations: Secondary | ICD-10-CM | POA: Diagnosis not present

## 2018-09-11 LAB — VERITOR FLU A/B WAIVED
Influenza A: POSITIVE — AB
Influenza B: NEGATIVE

## 2018-09-11 MED ORDER — OSELTAMIVIR PHOSPHATE 75 MG PO CAPS: 75.0000 mg | ORAL_CAPSULE | Freq: Two times a day (BID) | ORAL | 0 refills | Status: AC

## 2018-09-11 NOTE — Patient Instructions (Signed)

## 2018-09-11 NOTE — Progress Notes (Signed)
Subjective:    Patient ID: Victoria Perry, female    DOB: 1961-05-19, 58 y.o.   MRN: [id number]  Chief Complaint:  Cough, chest congestion and burning, chills (husband diagnosed with flu on Friday, her symptoms began Saturday night)   HPI: Victoria Perry is a 58 y.o. female presenting on 09/11/2018 for Cough, chest congestion and burning, chills (husband diagnosed with flu on Friday, her symptoms began Saturday night)  Pt presents today with complaints of cough, congestion, sore throat, fever, chills, and fatigue. She states her symptoms started late Saturday night. States her husband was diagnosed with the flu on Friday. States she has been taking tylenol to manage her fever and body aches.   Relevant past medical, surgical, family, and social history reviewed and updated as indicated.  Allergies and medications reviewed and updated.   Past Medical History:  Diagnosis Date  . Anxiety   . Asthma   . Depression     Past Surgical History:  Procedure Laterality Date  . ABLATION      Social History   Socioeconomic History  . Marital status: Married    Spouse name: Not on file  . Number of children: Not on file  . Years of education: Not on file  . Highest education level: Not on file  Occupational History  . Not on file  Social Needs  . Financial resource strain: Not on file  . Food insecurity:    Worry: Not on file    Inability: Not on file  . Transportation needs:    Medical: Not on file    Non-medical: Not on file  Tobacco Use  . Smoking status: Never Smoker  . Smokeless tobacco: Never Used  Substance and Sexual Activity  . Alcohol use: Yes    Frequency: Never    Comment: occas  . Drug use: Never  . Sexual activity: Not Currently  Lifestyle  . Physical activity:    Days per week: Not on file    Minutes per session: Not on file  . Stress: Not on file  Relationships  . Social connections:    Talks on phone: Not on file    Gets together: Not on  file    Attends religious service: Not on file    Active member of club or organization: Not on file    Attends meetings of clubs or organizations: Not on file    Relationship status: Not on file  . Intimate partner violence:    Fear of current or ex partner: Not on file    Emotionally abused: Not on file    Physically abused: Not on file    Forced sexual activity: Not on file  Other Topics Concern  . Not on file  Social History Narrative  . Not on file    Outpatient Encounter Medications as of 09/11/2018  Medication Sig  . DULoxetine (CYMBALTA) 30 MG capsule Take 3 capsules (90 mg total) by mouth daily.  . fluticasone (FLONASE) 50 MCG/ACT nasal spray Place 2 sprays into both nostrils daily.  Marland Kitchen HYDROcodone-acetaminophen (NORCO) 10-325 MG tablet Take 1 tablet by mouth every 8 (eight) weeks.  Marland Kitchen HYDROcodone-acetaminophen (NORCO) 10-325 MG tablet Take 1 tablet by mouth every 8 (eight) hours as needed.  Marland Kitchen HYDROcodone-acetaminophen (NORCO) 10-325 MG tablet Take 1 tablet by mouth every 8 (eight) hours as needed.  . naproxen sodium (ALEVE) 220 MG tablet Take 440 mg by mouth daily as needed (for pain or headache).  Marland Kitchen omeprazole (  PRILOSEC) 20 MG capsule Take 1 capsule (20 mg total) by mouth daily.  Marland Kitchen. oseltamivir (TAMIFLU) 75 MG capsule Take 1 capsule (75 mg total) by mouth 2 (two) times daily for 5 days.   No facility-administered encounter medications on file as of 09/11/2018.     No Known Allergies  Review of Systems  Constitutional: Positive for chills, fatigue and fever.  HENT: Positive for congestion and sore throat.   Respiratory: Positive for cough. Negative for chest tightness and shortness of breath.   Cardiovascular: Negative for chest pain and palpitations.  Musculoskeletal: Positive for myalgias.  Neurological: Positive for headaches. Negative for dizziness and weakness.  Psychiatric/Behavioral: Negative for confusion.  All other systems reviewed and are negative.        Objective:    BP 138/70   Pulse 94   Temp (!) 101.1 F (38.4 C) (Oral)   Ht 5\' 3"  (1.6 m)   Wt 152 lb (68.9 kg)   BMI 26.93 kg/m    Wt Readings from Last 3 Encounters:  09/11/18 152 lb (68.9 kg)  08/14/18 156 lb 6.4 oz (70.9 kg)  08/02/18 152 lb 9.6 oz (69.2 kg)    Physical Exam Vitals signs and nursing note reviewed.  Constitutional:      General: She is in acute distress (mild).     Appearance: She is well-developed and well-groomed.  HENT:     Head: Normocephalic and atraumatic.     Right Ear: Hearing, tympanic membrane, ear canal and external ear normal.     Left Ear: Hearing, tympanic membrane, ear canal and external ear normal.     Nose: Congestion present.     Right Sinus: No maxillary sinus tenderness or frontal sinus tenderness.     Left Sinus: No maxillary sinus tenderness or frontal sinus tenderness.     Mouth/Throat:     Lips: Pink.     Mouth: Mucous membranes are moist.     Pharynx: Uvula midline. Posterior oropharyngeal erythema present. No oropharyngeal exudate.  Eyes:     General: Lids are normal.     Conjunctiva/sclera: Conjunctivae normal.     Pupils: Pupils are equal, round, and reactive to light.  Neck:     Musculoskeletal: Neck supple.     Thyroid: No thyroid mass, thyromegaly or thyroid tenderness.     Trachea: Trachea and phonation normal.  Cardiovascular:     Rate and Rhythm: Normal rate.     Heart sounds: Normal heart sounds. No murmur. No friction rub. No gallop.   Pulmonary:     Effort: Pulmonary effort is normal. No respiratory distress.     Breath sounds: Normal breath sounds.  Lymphadenopathy:     Cervical: No cervical adenopathy.  Skin:    General: Skin is warm and dry.     Capillary Refill: Capillary refill takes less than 2 seconds.  Neurological:     General: No focal deficit present.     Mental Status: She is alert and oriented to person, place, and time.  Psychiatric:        Mood and Affect: Mood normal.        Behavior:  Behavior normal. Behavior is cooperative.        Thought Content: Thought content normal.        Judgment: Judgment normal.     Results for orders placed or performed in visit on 04/05/18  HM MAMMOGRAPHY  Result Value Ref Range   HM Mammogram 0-4 Bi-Rad 0-4 Bi-Rad, Self Reported Normal  Influenza A positive  Pertinent labs & imaging results that were available during my care of the patient were reviewed by me and considered in my medical decision making.  Assessment & Plan:  Victoria Perry was seen today for cough, chest congestion and burning, chills.  Diagnoses and all orders for this visit:  Fever and chills -     Veritor Flu A/B Waived  Influenza A Positive for influenza A. Symptomatic care discussed. Stay hydrated. Infection prevention discussed. Medications as prescribed. Report any new or worsening symptoms.  -     oseltamivir (TAMIFLU) 75 MG capsule; Take 1 capsule (75 mg total) by mouth 2 (two) times daily for 5 days.     Continue all other maintenance medications.  Follow up plan: Return if symptoms worsen or fail to improve.  Educational handout given for influenza  The above assessment and management plan was discussed with the patient. The patient verbalized understanding of and has agreed to the management plan. Patient is aware to call the clinic if symptoms persist or worsen. Patient is aware when to return to the clinic for a follow-up visit. Patient educated on when it is appropriate to go to the emergency department.   Kari BaarsMichelle Rakes, FNP-C [facility] [phone]

## 2018-09-15 ENCOUNTER — Encounter: Payer: Self-pay | Admitting: Physician Assistant

## 2018-09-15 ENCOUNTER — Ambulatory Visit (INDEPENDENT_AMBULATORY_CARE_PROVIDER_SITE_OTHER): Payer: 59 | Admitting: Physician Assistant

## 2018-09-15 ENCOUNTER — Encounter (INDEPENDENT_AMBULATORY_CARE_PROVIDER_SITE_OTHER): Payer: Self-pay

## 2018-09-15 VITALS — BP 114/68 | HR 69 | Temp 97.0°F | Ht 63.0 in | Wt 149.0 lb

## 2018-09-15 DIAGNOSIS — F339 Major depressive disorder, recurrent, unspecified: Secondary | ICD-10-CM

## 2018-09-15 DIAGNOSIS — G5702 Lesion of sciatic nerve, left lower limb: Secondary | ICD-10-CM | POA: Diagnosis not present

## 2018-09-15 NOTE — Progress Notes (Signed)
BP 114/68   Pulse 69   Temp (!) 97 F (36.1 C) (Oral)   Ht 5\' 3"  (1.6 m)   Wt 149 lb (67.6 kg)   BMI 26.39 kg/m    Subjective:    Patient ID: Victoria Perry, female    DOB: [birth year], 58 y.o.   MRN: [id number]  HPI: Victoria Perry is a 58 y.o. female presenting on 09/15/2018 for Back Pain (1 month follow up)  Patient comes in for 1 month recheck on her Cymbalta.  Increase it to 90 mg last month in order to help with depression but also with chronic pain.  She has noticed a big difference in her pain.  She has been able to use less of the hydrocodone.  She is very excited about these changes.  She denies any other difficulty with the medication.  She did recently have the flu and is still recovering from that.   Past Medical History:  Diagnosis Date  . Anxiety   . Asthma   . Depression    Relevant past medical, surgical, family and social history reviewed and updated as indicated. Interim medical history since our last visit reviewed. Allergies and medications reviewed and updated. DATA REVIEWED: CHART IN EPIC  Family History reviewed for pertinent findings.  Review of Systems  Constitutional: Negative.   HENT: Negative.   Eyes: Negative.   Respiratory: Negative.   Gastrointestinal: Negative.   Genitourinary: Negative.     Allergies as of 09/15/2018   No Known Allergies     Medication List       Accurate as of September 15, 2018  8:20 AM. Always use your most recent med list.        DULoxetine 30 MG capsule Commonly known as:  CYMBALTA Take 3 capsules (90 mg total) by mouth daily.   fluticasone 50 MCG/ACT nasal spray Commonly known as:  FLONASE Place 2 sprays into both nostrils daily.   HYDROcodone-acetaminophen 10-325 MG tablet Commonly known as:  NORCO Take 1 tablet by mouth every 8 (eight) weeks.   HYDROcodone-acetaminophen 10-325 MG tablet Commonly known as:  NORCO Take 1 tablet by mouth every 8 (eight) hours as needed.     HYDROcodone-acetaminophen 10-325 MG tablet Commonly known as:  NORCO Take 1 tablet by mouth every 8 (eight) hours as needed.   naproxen sodium 220 MG tablet Commonly known as:  ALEVE Take 440 mg by mouth daily as needed (for pain or headache).   omeprazole 20 MG capsule Commonly known as:  PRILOSEC Take 1 capsule (20 mg total) by mouth daily.   oseltamivir 75 MG capsule Commonly known as:  TAMIFLU Take 1 capsule (75 mg total) by mouth 2 (two) times daily for 5 days.          Objective:    BP 114/68   Pulse 69   Temp (!) 97 F (36.1 C) (Oral)   Ht 5\' 3"  (1.6 m)   Wt 149 lb (67.6 kg)   BMI 26.39 kg/m   No Known Allergies  Wt Readings from Last 3 Encounters:  09/15/18 149 lb (67.6 kg)  09/11/18 152 lb (68.9 kg)  08/14/18 156 lb 6.4 oz (70.9 kg)    Physical Exam Constitutional:      Appearance: She is well-developed.  HENT:     Head: Normocephalic and atraumatic.  Eyes:     Conjunctiva/sclera: Conjunctivae normal.     Pupils: Pupils are equal, round, and reactive to light.  Cardiovascular:  Rate and Rhythm: Normal rate and regular rhythm.     Heart sounds: Normal heart sounds.  Pulmonary:     Effort: Pulmonary effort is normal.     Breath sounds: Normal breath sounds.  Abdominal:     General: Bowel sounds are normal.     Palpations: Abdomen is soft.  Skin:    General: Skin is warm and dry.     Findings: No rash.  Neurological:     Mental Status: She is alert and oriented to person, place, and time.     Deep Tendon Reflexes: Reflexes are normal and symmetric.  Psychiatric:        Behavior: Behavior normal.        Thought Content: Thought content normal.        Judgment: Judgment normal.     Results for orders placed or performed in visit on 09/11/18  Veritor Flu A/B Waived  Result Value Ref Range   Influenza A Positive (A) Negative   Influenza B Negative Negative      Assessment & Plan:   1. Neuropathy of left sciatic nerve Cymbalta 90 mg  daily  2. Depression, recurrent (HCC) Cymbalta 90 mg daily   Continue all other maintenance medications as listed above.  Follow up plan: Return in about 3 months (around 12/15/2018).  Educational handout given for survey  Remus Loffler PA-C [facility] [address]  [place], [place] [address] [phone]   09/15/2018, 8:20 AM

## 2018-10-19 ENCOUNTER — Other Ambulatory Visit: Payer: Self-pay | Admitting: Physician Assistant

## 2018-10-19 DIAGNOSIS — F339 Major depressive disorder, recurrent, unspecified: Secondary | ICD-10-CM

## 2018-10-19 DIAGNOSIS — G5702 Lesion of sciatic nerve, left lower limb: Secondary | ICD-10-CM

## 2018-10-31 ENCOUNTER — Telehealth: Payer: Self-pay | Admitting: Gastroenterology

## 2018-10-31 NOTE — Telephone Encounter (Signed)
Please send patient a letter letting her know the labs done at [facility] in 05/2018 were negative for celiac. Ask her to call back for ov when she is ready to consider rescheduling colonoscopy.

## 2018-10-31 NOTE — Telephone Encounter (Signed)
Noted. Letter mailed.  

## 2018-11-02 ENCOUNTER — Other Ambulatory Visit: Payer: Self-pay | Admitting: Physician Assistant

## 2018-11-02 DIAGNOSIS — G5702 Lesion of sciatic nerve, left lower limb: Secondary | ICD-10-CM

## 2018-11-02 DIAGNOSIS — F339 Major depressive disorder, recurrent, unspecified: Secondary | ICD-10-CM

## 2018-11-03 ENCOUNTER — Other Ambulatory Visit: Payer: Self-pay | Admitting: Physician Assistant

## 2018-11-03 DIAGNOSIS — F339 Major depressive disorder, recurrent, unspecified: Secondary | ICD-10-CM

## 2018-11-03 DIAGNOSIS — G5702 Lesion of sciatic nerve, left lower limb: Secondary | ICD-10-CM

## 2018-11-27 ENCOUNTER — Telehealth: Payer: Self-pay | Admitting: Physician Assistant

## 2018-11-27 ENCOUNTER — Other Ambulatory Visit: Payer: Self-pay | Admitting: Physician Assistant

## 2018-11-27 MED ORDER — SULFAMETHOXAZOLE-TRIMETHOPRIM 800-160 MG PO TABS: 1.0000 | ORAL_TABLET | Freq: Two times a day (BID) | ORAL | 0 refills | Status: DC

## 2018-11-27 NOTE — Telephone Encounter (Signed)
Pharmacy: [facility]  Pt states that she thinks she has a UTI, been going on since Friday, tried OTC nothing worked, Frequent Bathroom going every 15 mins very litter urine, burns and hurts when she does urinate and even when she lays in bed. Would like to know if AJ can call something in so she doesn't have to come in the office

## 2018-11-27 NOTE — Telephone Encounter (Signed)
Sent bactrim to the pharamcy

## 2018-11-27 NOTE — Telephone Encounter (Signed)
Pt aware rx sent to pharmacy.

## 2019-01-11 ENCOUNTER — Ambulatory Visit (INDEPENDENT_AMBULATORY_CARE_PROVIDER_SITE_OTHER): Payer: 59 | Admitting: Family Medicine

## 2019-01-11 ENCOUNTER — Encounter: Payer: Self-pay | Admitting: Family Medicine

## 2019-01-11 ENCOUNTER — Other Ambulatory Visit: Payer: Self-pay

## 2019-01-11 DIAGNOSIS — K529 Noninfective gastroenteritis and colitis, unspecified: Secondary | ICD-10-CM | POA: Diagnosis not present

## 2019-01-11 NOTE — Progress Notes (Signed)
    Subjective:    Patient ID: Victoria Perry, female    DOB: 08-01-1961, 58 y.o.   MRN: [id number]   HPI: Victoria Perry is a 58 y.o. female presenting for vomiting X 2 yesterday. Once earlier today. One episode of diarrhea last night. Now fine. Holding down bland soft food. Chills last night. No fever. Denies cough and dyspnea. Washed out now. A little abdominal pain as well. Otherwise much improved.    Depression screen [facility] 2/9 09/15/2018 09/11/2018 08/14/2018 08/02/2018 03/13/2018  Decreased Interest 0 0 1 1 0  Down, Depressed, Hopeless 0 0 1 1 0  PHQ - 2 Score 0 0 2 2 0  Altered sleeping - - 1 1 -  Tired, decreased energy - - 1 1 -  Change in appetite - - 1 1 -  Feeling bad or failure about yourself  - - 1 1 -  Trouble concentrating - - 1 1 -  Moving slowly or fidgety/restless - - 0 0 -  Suicidal thoughts - - 1 1 -  PHQ-9 Score - - 8 8 -     Relevant past medical, surgical, family and social history reviewed and updated as indicated.  Interim medical history since our last visit reviewed. Allergies and medications reviewed and updated.  ROS:  Review of Systems  Constitutional: Negative for appetite change, chills, diaphoresis, fatigue and fever.  HENT: Negative for congestion, ear pain, hearing loss, postnasal drip, rhinorrhea, sore throat and trouble swallowing.   Respiratory: Negative for cough, chest tightness and shortness of breath.   Cardiovascular: Negative for chest pain and palpitations.  Gastrointestinal: Positive for abdominal pain, diarrhea, nausea and vomiting. Negative for abdominal distention, blood in stool and constipation.  Musculoskeletal: Negative for arthralgias.  Skin: Negative for rash.     Social History   Tobacco Use  Smoking Status Never Smoker  Smokeless Tobacco Never Used       Objective:     Wt Readings from Last 3 Encounters:  09/15/18 149 lb (67.6 kg)  09/11/18 152 lb (68.9 kg)  08/14/18 156 lb 6.4 oz (70.9 kg)     Exam  deferred. Pt. Harboring due to COVID 19. Phone visit performed.   Assessment & Plan:   1. Gastroenteritis, acute     No orders of the defined types were placed in this encounter.   No orders of the defined types were placed in this encounter.     Diagnoses and all orders for this visit:  Gastroenteritis, acute    Virtual Visit via telephone Note  I discussed the limitations, risks, security and privacy concerns of performing an evaluation and management service by telephone and the availability of in person appointments. The patient was identified with two identifiers. Pt.expressed understanding and agreed to proceed. Pt. Is at home. Dr. Darlyn Read is in his office.  Follow Up Instructions:   I discussed the assessment and treatment plan with the patient. The patient was provided an opportunity to ask questions and all were answered. The patient agreed with the plan and demonstrated an understanding of the instructions.   The patient was advised to call back or seek an in-person evaluation if the symptoms worsen or if the condition fails to improve as anticipated.   Total minutes including chart review and phone contact time: 7   Follow up plan: Return if symptoms worsen or fail to improve.  Victoria Claude, MD [facility]

## 2019-01-18 ENCOUNTER — Other Ambulatory Visit: Payer: Self-pay | Admitting: Physician Assistant

## 2019-05-01 ENCOUNTER — Emergency Department (HOSPITAL_COMMUNITY): Payer: Self-pay

## 2019-05-01 ENCOUNTER — Other Ambulatory Visit: Payer: Self-pay

## 2019-05-01 ENCOUNTER — Emergency Department (HOSPITAL_COMMUNITY)
Admission: EM | Admit: 2019-05-01 | Discharge: 2019-05-01 | Disposition: A | Discharge status: Home / Self Care | Payer: Self-pay | Attending: Emergency Medicine | Admitting: Emergency Medicine

## 2019-05-01 DIAGNOSIS — S060X0A Concussion without loss of consciousness, initial encounter: Secondary | ICD-10-CM | POA: Insufficient documentation

## 2019-05-01 DIAGNOSIS — Y929 Unspecified place or not applicable: Secondary | ICD-10-CM | POA: Insufficient documentation

## 2019-05-01 DIAGNOSIS — W01198A Fall on same level from slipping, tripping and stumbling with subsequent striking against other object, initial encounter: Secondary | ICD-10-CM | POA: Insufficient documentation

## 2019-05-01 DIAGNOSIS — Y999 Unspecified external cause status: Secondary | ICD-10-CM | POA: Insufficient documentation

## 2019-05-01 DIAGNOSIS — Y939 Activity, unspecified: Secondary | ICD-10-CM | POA: Insufficient documentation

## 2019-05-01 DIAGNOSIS — Z79899 Other long term (current) drug therapy: Secondary | ICD-10-CM | POA: Insufficient documentation

## 2019-05-01 DIAGNOSIS — J45909 Unspecified asthma, uncomplicated: Secondary | ICD-10-CM | POA: Insufficient documentation

## 2019-05-01 LAB — CBG MONITORING, ED: Glucose-Capillary: 77 mg/dL (ref 70–99)

## 2019-05-01 MED ORDER — ONDANSETRON HCL 4 MG/2ML IJ SOLN: 4.0000 mg | Freq: Once | INTRAMUSCULAR | Status: DC

## 2019-05-01 MED ORDER — MECLIZINE HCL 25 MG PO TABS: 25.0000 mg | ORAL_TABLET | Freq: Three times a day (TID) | ORAL | 0 refills | Status: AC | PRN

## 2019-05-01 MED ORDER — ONDANSETRON 4 MG PO TBDP
4.0000 mg | ORAL_TABLET | Freq: Once | ORAL | Status: AC
  Administered 2019-05-01: 4 mg via ORAL
  Filled 2019-05-01: qty 1

## 2019-05-01 MED ORDER — ONDANSETRON 8 MG PO TBDP: 8.0000 mg | ORAL_TABLET | Freq: Three times a day (TID) | ORAL | 0 refills | Status: AC | PRN

## 2019-05-01 NOTE — ED Notes (Signed)
No respiratory or acute distress noted alert and oriented x 3 no reaction to medication given call light in reach clear speech noted moves all extremities.

## 2019-05-01 NOTE — ED Provider Notes (Signed)
[facility]   CSN: [id number] Arrival date & time: 05/01/19  1203     History   Chief Complaint Chief Complaint  Patient presents with  . Concussion  . Fall    last Thursday  . Dizziness    HPI Victoria Perry is a 58 y.o. female.     HPI Patient states she injured her head on Thursday.  She fell and struck her head against a chicken coop.  Patient did not lose consciousness but she became extremely dazed.  Over the last several days since then she has been having episodes of nausea vomiting and dizziness.  She has had a persistent headache that is moderate to severe.  Patient states she will become dizzy feels shaky and then will vomit.  Movement increases her symptoms.  She does feel like the room spins.  She denies any fevers or chills.  No focal numbness or weakness.  Patient called her doctor who felt she should have an evaluation in the ED. Past Medical History:  Diagnosis Date  . Anxiety   . Asthma   . Depression     Patient Active Problem List   Diagnosis Date Noted  . Rectal bleeding 06/02/2018  . Diarrhea 06/02/2018  . Abdominal pain 06/02/2018  . FH: colon cancer 06/02/2018  . Depression, recurrent (HCC) 12/25/2017  . Neuropathy of left sciatic nerve 12/25/2017    Past Surgical History:  Procedure Laterality Date  . ABLATION       OB History   No obstetric history on file.      Home Medications    Prior to Admission medications   Medication Sig Start Date End Date Taking? Authorizing Provider  DULoxetine (CYMBALTA) 30 MG capsule Take 3 capsules (90 mg total) by mouth daily. 08/14/18   Remus LofflerJones, Angel S, PA-C  DULoxetine (CYMBALTA) 60 MG capsule TAKE 1 CAPSULE BY MOUTH EVERY DAY 10/19/18   Remus LofflerJones, Angel S, PA-C  fluticasone (FLONASE) 50 MCG/ACT nasal spray Place 2 sprays into both nostrils daily. 08/02/18   Remus LofflerJones, Angel S, PA-C  HYDROcodone-acetaminophen (NORCO) 10-325 MG tablet Take 1 tablet by mouth  every 8 (eight) weeks. 08/14/18   Remus LofflerJones, Angel S, PA-C  HYDROcodone-acetaminophen (NORCO) 10-325 MG tablet Take 1 tablet by mouth every 8 (eight) hours as needed. 08/14/18   Remus LofflerJones, Angel S, PA-C  HYDROcodone-acetaminophen (NORCO) 10-325 MG tablet Take 1 tablet by mouth every 8 (eight) hours as needed. 08/14/18   Remus LofflerJones, Angel S, PA-C  meclizine (ANTIVERT) 25 MG tablet Take 1 tablet (25 mg total) by mouth 3 (three) times daily as needed for dizziness. 05/01/19   Linwood DibblesKnapp, Tavarion Babington, MD  naproxen sodium (ALEVE) 220 MG tablet Take 440 mg by mouth daily as needed (for pain or headache).    [provider]  omeprazole (PRILOSEC) 20 MG capsule TAKE 1 CAPSULE BY MOUTH EVERY DAY 01/19/19   Remus LofflerJones, Angel S, PA-C  ondansetron (ZOFRAN ODT) 8 MG disintegrating tablet Take 1 tablet (8 mg total) by mouth every 8 (eight) hours as needed for nausea or vomiting. 05/01/19   Linwood DibblesKnapp, Konrad Hoak, MD  sulfamethoxazole-trimethoprim (BACTRIM DS) 800-160 MG tablet Take 1 tablet by mouth 2 (two) times daily. 11/27/18   Remus LofflerJones, Angel S, PA-C    Family History Family History  Problem Relation Age of Onset  . Cancer Mother        colon. also on remicade for her colon but patient not sure why  . Depression Mother   .  Depression Sister   . Colitis Sister   . Colitis Brother     Social History Social History   Tobacco Use  . Smoking status: Never Smoker  . Smokeless tobacco: Never Used  Substance Use Topics  . Alcohol use: Yes    Frequency: Never    Comment: occas  . Drug use: Never     Allergies   Patient has no known allergies.   Review of Systems Review of Systems  All other systems reviewed and are negative.    Physical Exam Updated Vital Signs BP 116/63   Pulse 86   Temp 98.4 F (36.9 C) (Oral)   Resp 14   SpO2 96%   Physical Exam Vitals signs and nursing note reviewed.  Constitutional:      General: She is not in acute distress.    Appearance: She is well-developed.  HENT:     Head: Normocephalic  and atraumatic.     Right Ear: External ear normal.     Left Ear: External ear normal.  Eyes:     General: No scleral icterus.       Right eye: No discharge.        Left eye: No discharge.     Conjunctiva/sclera: Conjunctivae normal.  Neck:     Musculoskeletal: Neck supple.     Trachea: No tracheal deviation.  Cardiovascular:     Rate and Rhythm: Normal rate and regular rhythm.  Pulmonary:     Effort: Pulmonary effort is normal. No respiratory distress.     Breath sounds: Normal breath sounds. No stridor. No wheezing or rales.  Abdominal:     General: Bowel sounds are normal. There is no distension.     Palpations: Abdomen is soft.     Tenderness: There is no abdominal tenderness. There is no guarding or rebound.  Musculoskeletal:        General: No tenderness.  Skin:    General: Skin is warm and dry.     Findings: No rash.  Neurological:     Mental Status: She is alert.     Cranial Nerves: No cranial nerve deficit (no facial droop, extraocular movements intact, no slurred speech).     Sensory: No sensory deficit.     Motor: No abnormal muscle tone or seizure activity.     Coordination: Coordination normal.      ED Treatments / Results  Labs (all labs ordered are listed, but only abnormal results are displayed) Labs Reviewed  CBG MONITORING, ED    EKG None  Radiology Ct Head Wo Contrast  Result Date: 05/01/2019 CLINICAL DATA:  Head trauma 5 days ago. Headache, dizziness, and vomiting. EXAM: CT HEAD WITHOUT CONTRAST TECHNIQUE: Contiguous axial images were obtained from the base of the skull through the vertex without intravenous contrast. COMPARISON:  None. FINDINGS: Brain: No evidence of acute infarction, hemorrhage, hydrocephalus, extra-axial collection or mass lesion/mass effect. Vascular: No hyperdense vessel or unexpected calcification. Skull: Normal. Negative for fracture or focal lesion. Sinuses/Orbits: Normal. Other: None IMPRESSION: Normal exam. Electronically  Signed   By: Francene Boyers M.D.   On: 05/01/2019 18:58    Procedures Procedures (including critical care time)  Medications Ordered in ED Medications  ondansetron (ZOFRAN-ODT) disintegrating tablet 4 mg (4 mg Oral Given 05/01/19 1636)     Initial Impression / Assessment and Plan / ED Course  I have reviewed the triage vital signs and the nursing notes.  Pertinent labs & imaging results that were available during my care  of the patient were reviewed by me and considered in my medical decision making (see chart for details).   Patient presented with persistent headache and dizziness after recent head injury.  No focal deficits noted on exam.  CT scan does not show any evidence of acute injury.  Suspect she is having postconcussive symptoms.  Will discharge home with prescription for Zofran and meclizine.  Discussed outpatient follow-up if her symptoms do not improve.  Final Clinical Impressions(s) / ED Diagnoses   Final diagnoses:  Concussion without loss of consciousness, initial encounter    ED Discharge Orders         Ordered    ondansetron (ZOFRAN ODT) 8 MG disintegrating tablet  Every 8 hours PRN     05/01/19 1921    meclizine (ANTIVERT) 25 MG tablet  3 times daily PRN     05/01/19 Lillette Boxer, MD 05/01/19 Curly Rim

## 2019-05-01 NOTE — ED Triage Notes (Signed)
Pt reports that she fell on Thursday and hit her head on chicken coop. Reports since the weekend having shakes at 4am and 4pm then will vomit. pains c/o today is headache, dizzy, and  bilat leg pains.

## 2019-05-01 NOTE — Discharge Instructions (Addendum)
Take the medications as needed to help with the dizziness and nausea.  Follow-up with your primary care doctor if the symptoms are not improving within the week

## 2019-05-24 ENCOUNTER — Ambulatory Visit (INDEPENDENT_AMBULATORY_CARE_PROVIDER_SITE_OTHER): Payer: Self-pay | Admitting: Nurse Practitioner

## 2019-05-24 ENCOUNTER — Encounter: Payer: Self-pay | Admitting: Nurse Practitioner

## 2019-05-24 ENCOUNTER — Other Ambulatory Visit: Payer: Self-pay

## 2019-05-24 VITALS — BP 109/69 | HR 83 | Temp 97.7°F | Ht 63.0 in | Wt 138.0 lb

## 2019-05-24 DIAGNOSIS — R300 Dysuria: Secondary | ICD-10-CM

## 2019-05-24 DIAGNOSIS — N3000 Acute cystitis without hematuria: Secondary | ICD-10-CM

## 2019-05-24 LAB — URINALYSIS, COMPLETE
Bilirubin, UA: NEGATIVE
Glucose, UA: NEGATIVE
Ketones, UA: NEGATIVE
Nitrite, UA: NEGATIVE
Specific Gravity, UA: 1.02 (ref 1.005–1.030)
Urobilinogen, Ur: 0.2 mg/dL (ref 0.2–1.0)
pH, UA: 6 (ref 5.0–7.5)

## 2019-05-24 LAB — MICROSCOPIC EXAMINATION
Renal Epithel, UA: NONE SEEN /hpf
WBC, UA: >30 /hpf — AB (ref 0–5)

## 2019-05-24 MED ORDER — CIPROFLOXACIN HCL 500 MG PO TABS: 500.0000 mg | ORAL_TABLET | Freq: Two times a day (BID) | ORAL | 0 refills | Status: AC

## 2019-05-24 MED ORDER — PHENAZOPYRIDINE HCL 100 MG PO TABS: 100.0000 mg | ORAL_TABLET | Freq: Three times a day (TID) | ORAL | 0 refills | Status: DC | PRN

## 2019-05-24 NOTE — Progress Notes (Signed)
   Subjective:    Patient ID: Victoria Perry, female    DOB: 1961/01/05, 58 y.o.   MRN: [id number]   Chief Complaint: Dysuria   HPI Patient come sin c/o of dysuria with urgency and frequency. Started a week or so ago and has gotten worse. She was up all night having to go to the restroom.    Review of Systems  Constitutional: Negative for chills and fever.  HENT: Negative.   Respiratory: Negative.   Cardiovascular: Negative.   Gastrointestinal: Negative.  Negative for abdominal pain.  Genitourinary: Positive for dysuria, flank pain and urgency. Negative for pelvic pain.  Musculoskeletal: Negative for back pain.  Neurological: Negative.   Psychiatric/Behavioral: Negative.   All other systems reviewed and are negative.      Objective:   Physical Exam Vitals signs and nursing note reviewed.  Constitutional:      General: She is not in acute distress.    Appearance: Normal appearance. She is well-developed.  HENT:     Head: Normocephalic.     Nose: Nose normal.  Eyes:     Pupils: Pupils are equal, round, and reactive to light.  Neck:     Musculoskeletal: Normal range of motion and neck supple.     Vascular: No carotid bruit or JVD.  Cardiovascular:     Rate and Rhythm: Normal rate and regular rhythm.     Heart sounds: Normal heart sounds.  Pulmonary:     Effort: Pulmonary effort is normal. No respiratory distress.     Breath sounds: Normal breath sounds. No wheezing or rales.  Chest:     Chest wall: No tenderness.  Abdominal:     General: Bowel sounds are normal. There is no distension or abdominal bruit.     Palpations: Abdomen is soft. There is no hepatomegaly, splenomegaly, mass or pulsatile mass.     Tenderness: There is no abdominal tenderness.  Musculoskeletal: Normal range of motion.  Lymphadenopathy:     Cervical: No cervical adenopathy.  Skin:    General: Skin is warm and dry.  Neurological:     Mental Status: She is alert and oriented to person,  place, and time.     Deep Tendon Reflexes: Reflexes are normal and symmetric.  Psychiatric:        Behavior: Behavior normal.        Thought Content: Thought content normal.        Judgment: Judgment normal.     BP 109/69   Pulse 83   Temp 97.7 F (36.5 C) (Temporal)   Ht 5\' 3"  (1.6 m)   Wt 138 lb (62.6 kg)   BMI 24.45 kg/m        Assessment & Plan:  Victoria Perry in today with chief complaint of Dysuria   1. Dysuria - Urinalysis, Complete  2. Acute cystitis without hematuria Take medication as prescribe Cotton underwear Take shower not bath Cranberry juice, yogurt Force fluids Culture pending RTO prn  - phenazopyridine (PYRIDIUM) 100 MG tablet; Take 1 tablet (100 mg total) by mouth 3 (three) times daily as needed for pain.  Dispense: 10 tablet; Refill: 0 - ciprofloxacin (CIPRO) 500 MG tablet; Take 1 tablet (500 mg total) by mouth 2 (two) times daily.  Dispense: 14 tablet; Refill: 0 - Urine Culture  Mary-Margaret Hassell Done, FNP

## 2019-05-24 NOTE — Patient Instructions (Signed)
Urinary Tract Infection, Adult A urinary tract infection (UTI) is an infection of any part of the urinary tract. The urinary tract includes:  The kidneys.  The ureters.  The bladder.  The urethra. These organs make, store, and get rid of pee (urine) in the body. What are the causes? This is caused by germs (bacteria) in your genital area. These germs grow and cause swelling (inflammation) of your urinary tract. What increases the risk? You are more likely to develop this condition if:  You have a small, thin tube (catheter) to drain pee.  You cannot control when you pee or poop (incontinence).  You are female, and: ? You use these methods to prevent pregnancy: ? A medicine that kills sperm (spermicide). ? A device that blocks sperm (diaphragm). ? You have low levels of a female hormone (estrogen). ? You are pregnant.  You have genes that add to your risk.  You are sexually active.  You take antibiotic medicines.  You have trouble peeing because of: ? A prostate that is bigger than normal, if you are female. ? A blockage in the part of your body that drains pee from the bladder (urethra). ? A kidney stone. ? A nerve condition that affects your bladder (neurogenic bladder). ? Not getting enough to drink. ? Not peeing often enough.  You have other conditions, such as: ? Diabetes. ? A weak disease-fighting system (immune system). ? Sickle cell disease. ? Gout. ? Injury of the spine. What are the signs or symptoms? Symptoms of this condition include:  Needing to pee right away (urgently).  Peeing often.  Peeing small amounts often.  Pain or burning when peeing.  Blood in the pee.  Pee that smells bad or not like normal.  Trouble peeing.  Pee that is cloudy.  Fluid coming from the vagina, if you are female.  Pain in the belly or lower back. Other symptoms include:  Throwing up (vomiting).  No urge to eat.  Feeling mixed up (confused).  Being tired  and grouchy (irritable).  A fever.  Watery poop (diarrhea). How is this treated? This condition may be treated with:  Antibiotic medicine.  Other medicines.  Drinking enough water. Follow these instructions at home:  Medicines  Take over-the-counter and prescription medicines only as told by your doctor.  If you were prescribed an antibiotic medicine, take it as told by your doctor. Do not stop taking it even if you start to feel better. General instructions  Make sure you: ? Pee until your bladder is empty. ? Do not hold pee for a long time. ? Empty your bladder after sex. ? Wipe from front to back after pooping if you are a female. Use each tissue one time when you wipe.  Drink enough fluid to keep your pee pale yellow.  Keep all follow-up visits as told by your doctor. This is important. Contact a doctor if:  You do not get better after 1-2 days.  Your symptoms go away and then come back. Get help right away if:  You have very bad back pain.  You have very bad pain in your lower belly.  You have a fever.  You are sick to your stomach (nauseous).  You are throwing up. Summary  A urinary tract infection (UTI) is an infection of any part of the urinary tract.  This condition is caused by germs in your genital area.  There are many risk factors for a UTI. These include having a small, thin   tube to drain pee and not being able to control when you pee or poop.  Treatment includes antibiotic medicines for germs.  Drink enough fluid to keep your pee pale yellow. This information is not intended to replace advice given to you by your health care provider. Make sure you discuss any questions you have with your health care provider. Document Released: 02/02/2008 Document Revised: 08/03/2018 Document Reviewed: 02/23/2018 Elsevier Patient Education  2020 [employer]  

## 2019-05-25 ENCOUNTER — Telehealth: Payer: Self-pay | Admitting: Physician Assistant

## 2019-05-25 DIAGNOSIS — N3000 Acute cystitis without hematuria: Secondary | ICD-10-CM

## 2019-05-25 LAB — URINE CULTURE

## 2019-05-25 MED ORDER — PHENAZOPYRIDINE HCL 100 MG PO TABS: 100.0000 mg | ORAL_TABLET | Freq: Three times a day (TID) | ORAL | 0 refills | Status: AC | PRN

## 2019-05-25 NOTE — Telephone Encounter (Signed)
Script sent, okay to do note, she was seen by MMM yesterday

## 2019-05-25 NOTE — Telephone Encounter (Signed)
Letter ready for pick up.  Patient aware  

## 2019-05-25 NOTE — Telephone Encounter (Signed)
What is the name of the medication? phenazopyridine (PYRIDIUM) 100 MG tablet  Have you contacted your pharmacy to request a refill? no  Which pharmacy would you like this sent to? [facility]   Patient notified that their request is being sent to the clinical staff for review and that they should receive a call once it is complete. If they do not receive a call within 24 hours they can check with their pharmacy or our office.

## 2019-05-28 ENCOUNTER — Telehealth: Payer: Self-pay | Admitting: Physician Assistant

## 2019-05-28 NOTE — Telephone Encounter (Signed)
Patient aware of urine culture result.

## 2019-07-20 ENCOUNTER — Other Ambulatory Visit: Payer: Self-pay | Admitting: Physician Assistant

## 2019-07-20 DIAGNOSIS — J0110 Acute frontal sinusitis, unspecified: Secondary | ICD-10-CM

## 2020-02-21 IMAGING — CT CT HEAD W/O CM
3 series · 16 of 47 positions shown, 19 images · non-contrast
Comparison: None.

CLINICAL DATA: Head trauma 5 days ago. Headache, dizziness, and
vomiting.

EXAM:
CT HEAD WITHOUT CONTRAST
TECHNIQUE: Contiguous axial images were obtained from the base of the skull
through the vertex without intravenous contrast.

[Series 2: head wo · axial · 0.38mm/px · z∈[+1443,+1573]mm · 10 of 32 slices shown, 13 images]
[im 3/32  brain]
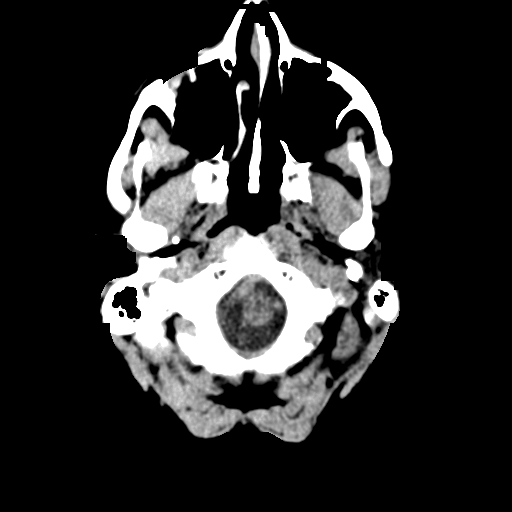
[im 3/32  bone]
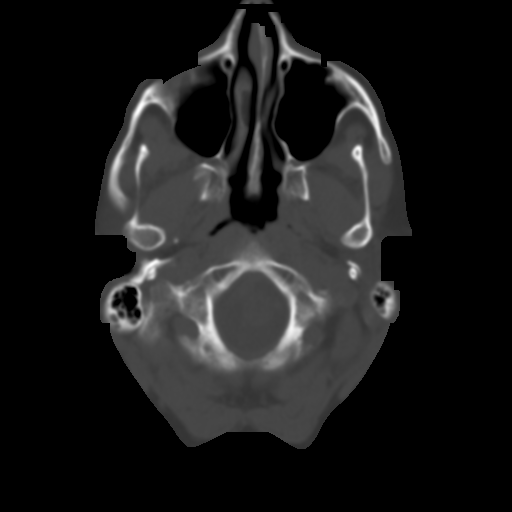
[im 6/32  brain]
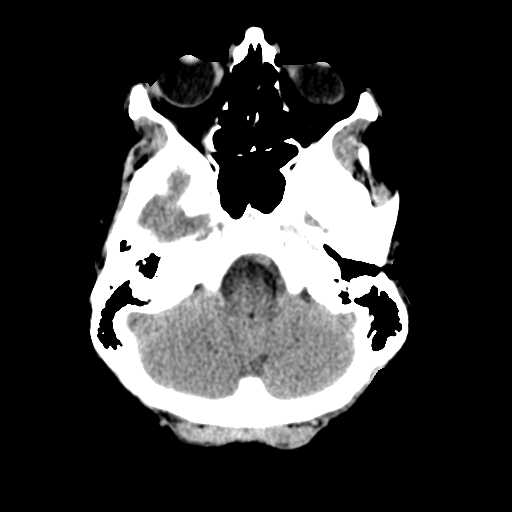
[im 9/32  brain]
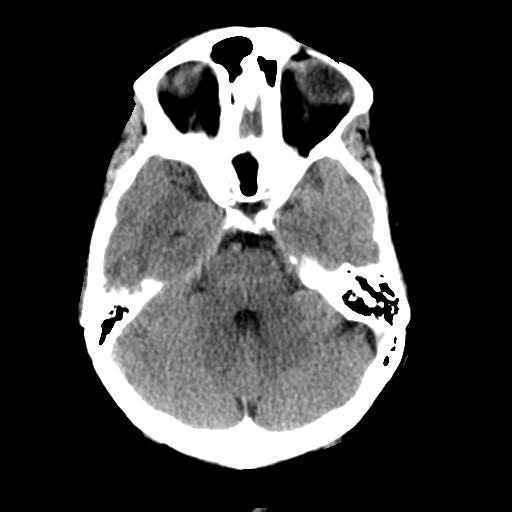
[im 11/32  brain]
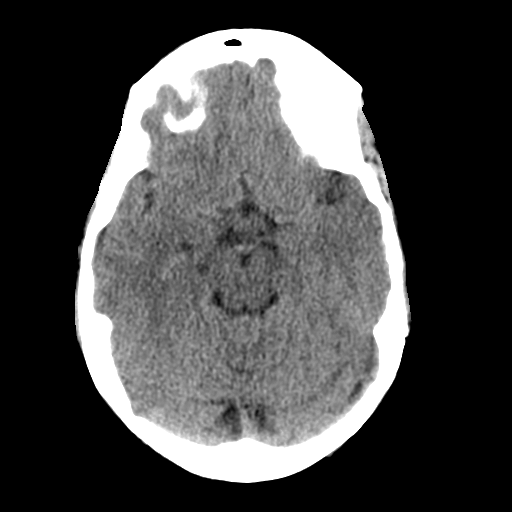
[im 14/32  brain]
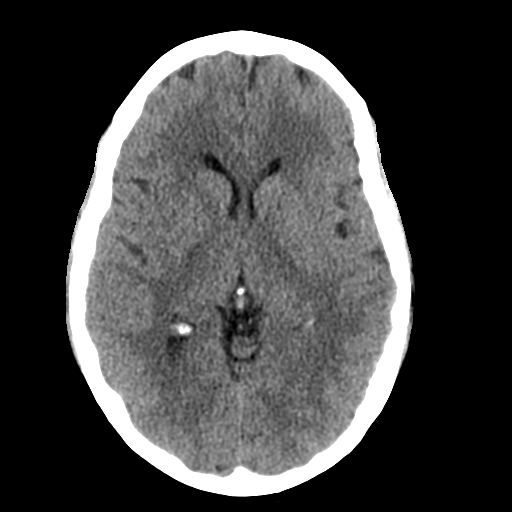
[im 14/32  bone]
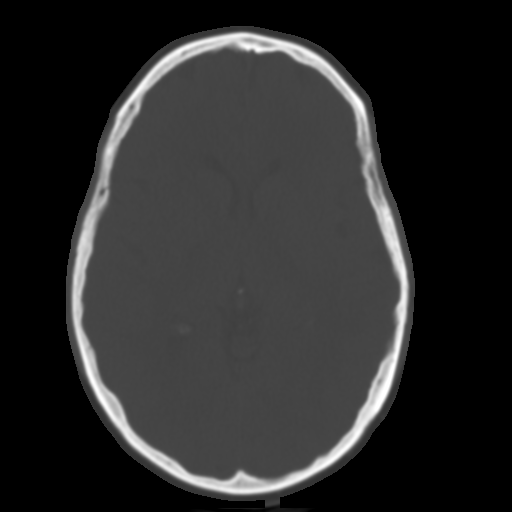
[im 18/32  brain]
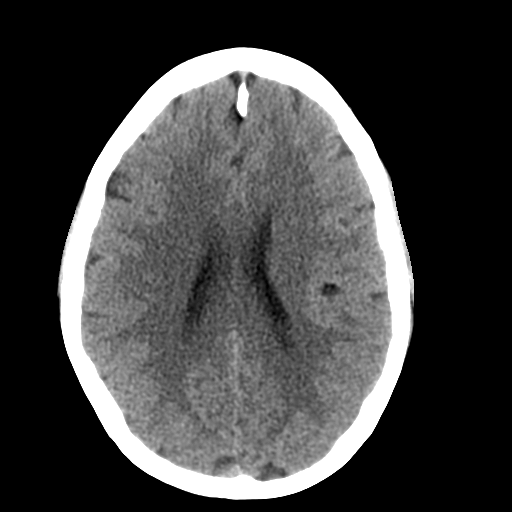
[im 21/32  brain]
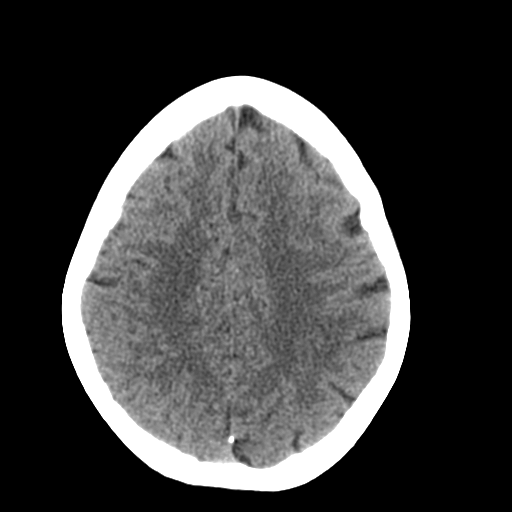
[im 24/32  brain]
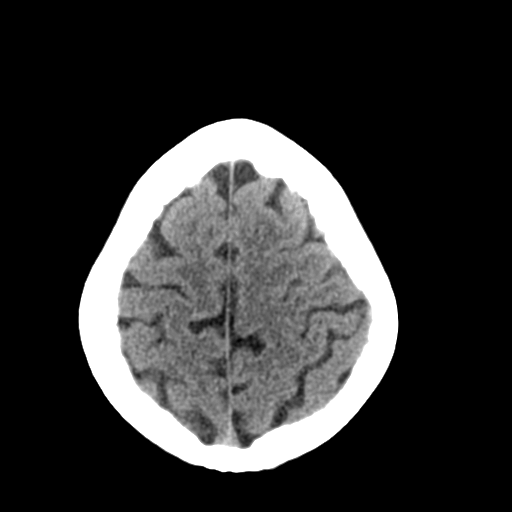
[im 26/32  brain]
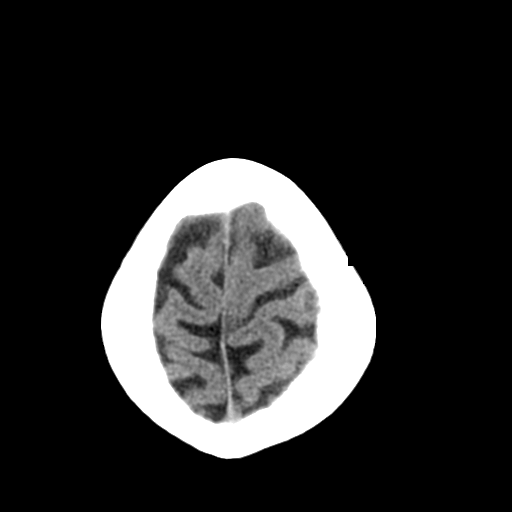
[im 26/32  bone]
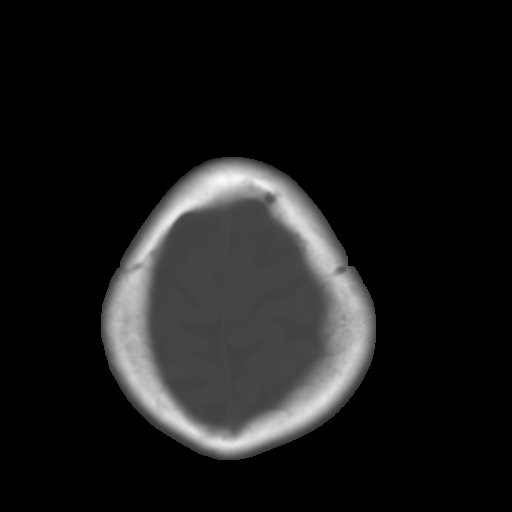
[im 29/32  brain]
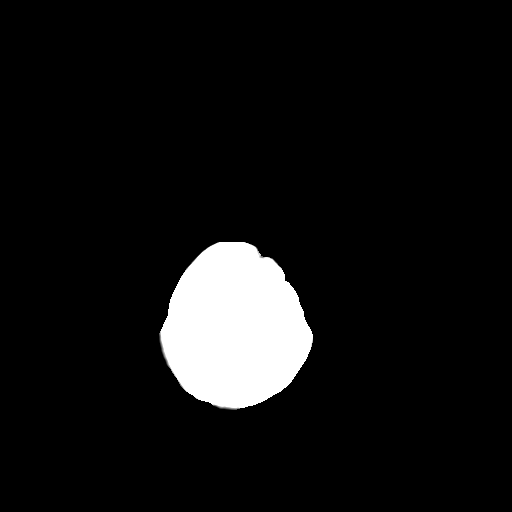

[Series 5: coronal soft tissue · coronal · 0.29mm/px · 3 of 61 slices shown]
[im 21/61  brain]
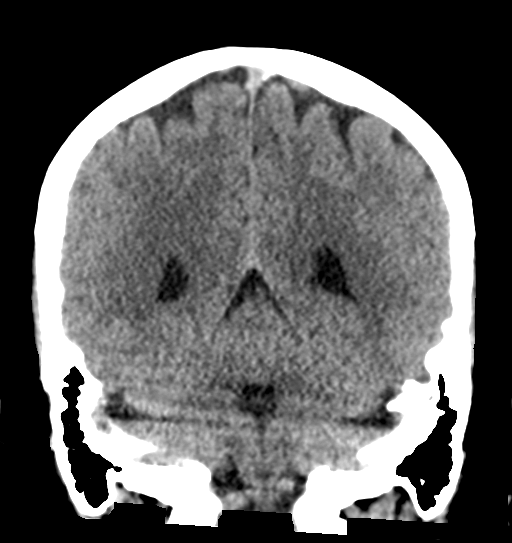
[im 27/61  brain]
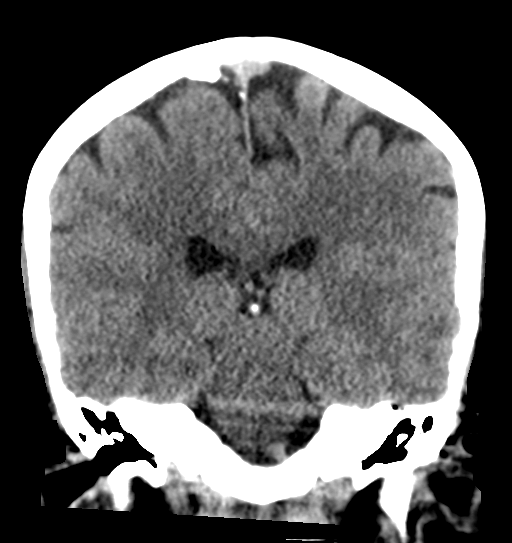
[im 34/61  brain]
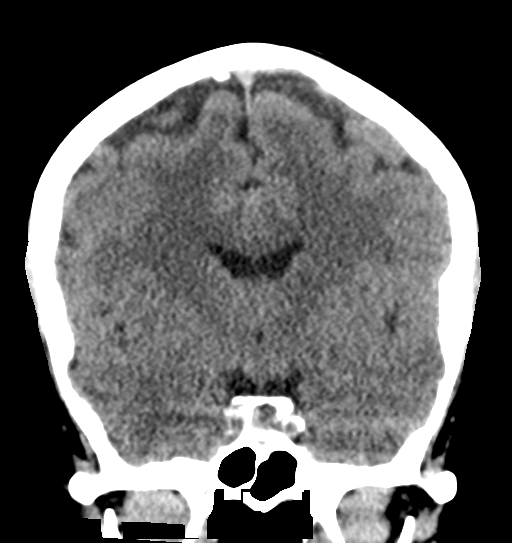

[Series 6: sagittal soft tissue · sagittal · 0.30mm/px · 3 of 50 slices shown]
[im 17/50  brain]
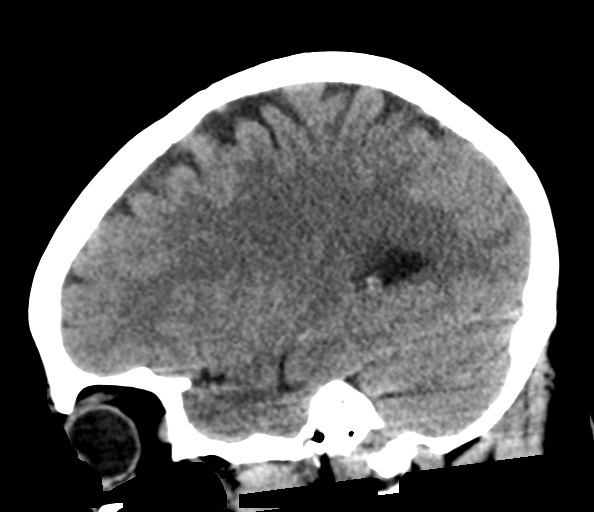
[im 25/50  brain]
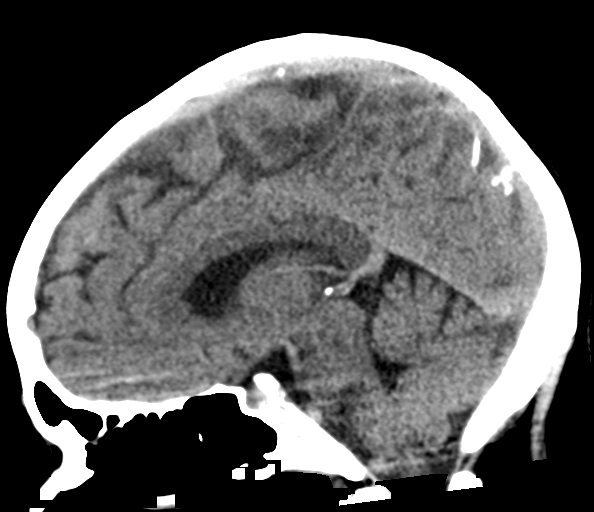
[im 33/50  brain]
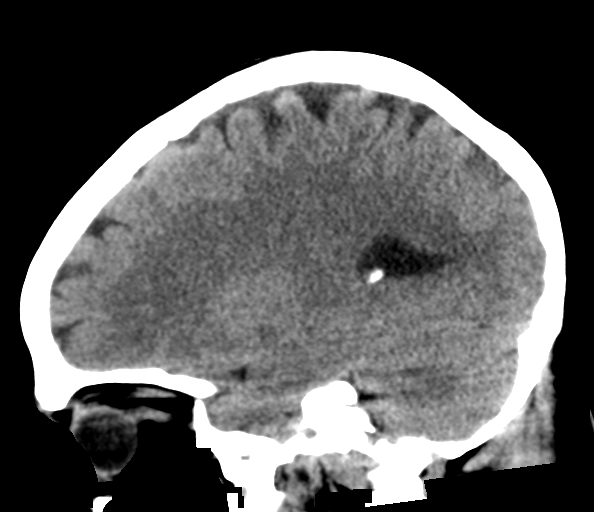

[16 of 47 positions shown; findings below may reference images not displayed]

FINDINGS: Brain: No evidence of acute infarction, hemorrhage, hydrocephalus,
extra-axial collection or mass lesion/mass effect.

Vascular: No hyperdense vessel or unexpected calcification.

Skull: Normal. Negative for fracture or focal lesion.

Sinuses/Orbits: Normal.

Other: None
IMPRESSION: Normal exam.
